# Patient Record
Sex: Female | Born: 1945 | Race: Black or African American | Hispanic: No | Marital: Married | Smoking: Never smoker
Health system: Southern US, Community
[De-identification: ages and names within clinical notes are randomized; demographics above are authoritative.]

## PROBLEM LIST (undated history)

## (undated) DIAGNOSIS — N189 Chronic kidney disease, unspecified: Secondary | ICD-10-CM

## (undated) DIAGNOSIS — L309 Dermatitis, unspecified: Secondary | ICD-10-CM

## (undated) DIAGNOSIS — R011 Cardiac murmur, unspecified: Secondary | ICD-10-CM

## (undated) DIAGNOSIS — R413 Other amnesia: Secondary | ICD-10-CM

## (undated) DIAGNOSIS — F29 Unspecified psychosis not due to a substance or known physiological condition: Secondary | ICD-10-CM

## (undated) DIAGNOSIS — I10 Essential (primary) hypertension: Secondary | ICD-10-CM

## (undated) DIAGNOSIS — G301 Alzheimer's disease with late onset: Principal | ICD-10-CM

## (undated) DIAGNOSIS — G43909 Migraine, unspecified, not intractable, without status migrainosus: Secondary | ICD-10-CM

## (undated) DIAGNOSIS — K219 Gastro-esophageal reflux disease without esophagitis: Secondary | ICD-10-CM

## (undated) DIAGNOSIS — E039 Hypothyroidism, unspecified: Secondary | ICD-10-CM

## (undated) DIAGNOSIS — Q249 Congenital malformation of heart, unspecified: Secondary | ICD-10-CM

## (undated) DIAGNOSIS — M1990 Unspecified osteoarthritis, unspecified site: Secondary | ICD-10-CM

## (undated) DIAGNOSIS — E119 Type 2 diabetes mellitus without complications: Secondary | ICD-10-CM

## (undated) DIAGNOSIS — E785 Hyperlipidemia, unspecified: Secondary | ICD-10-CM

## (undated) DIAGNOSIS — G3183 Dementia with Lewy bodies: Secondary | ICD-10-CM

## (undated) DIAGNOSIS — J310 Chronic rhinitis: Secondary | ICD-10-CM

## (undated) DIAGNOSIS — F329 Major depressive disorder, single episode, unspecified: Secondary | ICD-10-CM

## (undated) DIAGNOSIS — F0150 Vascular dementia without behavioral disturbance: Secondary | ICD-10-CM

## (undated) DIAGNOSIS — F0280 Dementia in other diseases classified elsewhere without behavioral disturbance: Principal | ICD-10-CM

## (undated) HISTORY — DX: Migraine, unspecified, not intractable, without status migrainosus: G43.909

## (undated) HISTORY — DX: Congenital malformation of heart, unspecified: Q24.9

## (undated) HISTORY — DX: Chronic rhinitis: J31.0

## (undated) HISTORY — DX: Vascular dementia without behavioral disturbance: F01.50

## (undated) HISTORY — DX: Unspecified psychosis not due to a substance or known physiological condition: F29

## (undated) HISTORY — PX: ABDOMINAL HYSTERECTOMY: SHX81

## (undated) HISTORY — PX: STOMACH SURGERY: SHX791

## (undated) HISTORY — DX: Hypothyroidism, unspecified: E03.9

## (undated) HISTORY — DX: Gastro-esophageal reflux disease without esophagitis: K21.9

## (undated) HISTORY — DX: Dementia in other diseases classified elsewhere without behavioral disturbance: F02.80

## (undated) HISTORY — DX: Cardiac murmur, unspecified: R01.1

## (undated) HISTORY — DX: Major depressive disorder, single episode, unspecified: F32.9

## (undated) HISTORY — PX: DENTAL SURGERY: SHX609

## (undated) HISTORY — DX: Essential (primary) hypertension: I10

## (undated) HISTORY — DX: Other amnesia: R41.3

## (undated) HISTORY — DX: Type 2 diabetes mellitus without complications: E11.9

## (undated) HISTORY — DX: Alzheimer's disease with late onset: G30.1

## (undated) HISTORY — DX: Hyperlipidemia, unspecified: E78.5

## (undated) HISTORY — DX: Dermatitis, unspecified: L30.9

## (undated) HISTORY — DX: Unspecified osteoarthritis, unspecified site: M19.90

## (undated) HISTORY — DX: Chronic kidney disease, unspecified: N18.9

## (undated) HISTORY — DX: Dementia with Lewy bodies: G31.83

## (undated) HISTORY — PX: THYROID SURGERY: SHX805

---

## 1998-01-26 ENCOUNTER — Ambulatory Visit (HOSPITAL_COMMUNITY): Admission: RE | Admit: 1998-01-26 | Discharge: 1998-01-26 | Payer: Self-pay | Admitting: General Surgery

## 2001-10-19 ENCOUNTER — Encounter: Admission: RE | Admit: 2001-10-19 | Discharge: 2001-10-19 | Payer: Self-pay | Admitting: Occupational Medicine

## 2001-10-19 ENCOUNTER — Encounter: Payer: Self-pay | Admitting: Occupational Medicine

## 2001-11-27 ENCOUNTER — Encounter: Admission: RE | Admit: 2001-11-27 | Discharge: 2002-02-25 | Payer: Self-pay | Admitting: Family Medicine

## 2003-09-04 ENCOUNTER — Encounter: Admission: RE | Admit: 2003-09-04 | Discharge: 2003-12-03 | Payer: Self-pay | Admitting: General Practice

## 2003-09-25 ENCOUNTER — Ambulatory Visit (HOSPITAL_COMMUNITY): Admission: RE | Admit: 2003-09-25 | Discharge: 2003-09-25 | Payer: Self-pay | Admitting: Family Medicine

## 2003-10-03 ENCOUNTER — Other Ambulatory Visit: Admission: RE | Admit: 2003-10-03 | Discharge: 2003-10-03 | Payer: Self-pay | Admitting: Obstetrics and Gynecology

## 2003-10-09 ENCOUNTER — Ambulatory Visit (HOSPITAL_COMMUNITY): Admission: RE | Admit: 2003-10-09 | Discharge: 2003-10-09 | Payer: Self-pay | Admitting: Obstetrics and Gynecology

## 2005-08-10 ENCOUNTER — Encounter (HOSPITAL_BASED_OUTPATIENT_CLINIC_OR_DEPARTMENT_OTHER): Payer: Self-pay | Admitting: General Surgery

## 2006-04-25 ENCOUNTER — Ambulatory Visit (HOSPITAL_COMMUNITY): Admission: RE | Admit: 2006-04-25 | Discharge: 2006-04-25 | Payer: Self-pay | Admitting: Internal Medicine

## 2006-08-16 ENCOUNTER — Inpatient Hospital Stay (HOSPITAL_COMMUNITY): Admission: EM | Admit: 2006-08-16 | Discharge: 2006-08-23 | Payer: Self-pay | Admitting: *Deleted

## 2006-08-19 ENCOUNTER — Encounter (INDEPENDENT_AMBULATORY_CARE_PROVIDER_SITE_OTHER): Payer: Self-pay | Admitting: Specialist

## 2006-09-02 ENCOUNTER — Emergency Department (HOSPITAL_COMMUNITY): Admission: EM | Admit: 2006-09-02 | Discharge: 2006-09-03 | Payer: Self-pay | Admitting: Emergency Medicine

## 2006-09-06 ENCOUNTER — Ambulatory Visit: Payer: Self-pay | Admitting: Gastroenterology

## 2007-04-30 ENCOUNTER — Ambulatory Visit (HOSPITAL_COMMUNITY): Admission: RE | Admit: 2007-04-30 | Discharge: 2007-04-30 | Payer: Self-pay | Admitting: Internal Medicine

## 2007-07-22 ENCOUNTER — Inpatient Hospital Stay (HOSPITAL_COMMUNITY): Admission: EM | Admit: 2007-07-22 | Discharge: 2007-07-30 | Payer: Self-pay | Admitting: Emergency Medicine

## 2007-07-26 ENCOUNTER — Ambulatory Visit: Payer: Self-pay | Admitting: Internal Medicine

## 2008-04-01 ENCOUNTER — Emergency Department (HOSPITAL_COMMUNITY): Admission: EM | Admit: 2008-04-01 | Discharge: 2008-04-01 | Payer: Self-pay | Admitting: Emergency Medicine

## 2008-09-17 ENCOUNTER — Encounter: Admission: RE | Admit: 2008-09-17 | Discharge: 2008-09-17 | Payer: Self-pay | Admitting: Internal Medicine

## 2009-11-04 ENCOUNTER — Inpatient Hospital Stay (HOSPITAL_COMMUNITY): Admission: EM | Admit: 2009-11-04 | Discharge: 2009-11-08 | Payer: Self-pay | Admitting: Emergency Medicine

## 2009-11-04 ENCOUNTER — Ambulatory Visit: Payer: Self-pay | Admitting: Internal Medicine

## 2010-01-08 ENCOUNTER — Encounter: Admission: RE | Admit: 2010-01-08 | Discharge: 2010-01-08 | Payer: Self-pay | Admitting: Internal Medicine

## 2010-01-15 ENCOUNTER — Encounter: Admission: RE | Admit: 2010-01-15 | Discharge: 2010-01-15 | Payer: Self-pay | Admitting: Internal Medicine

## 2010-06-26 LAB — GLUCOSE, CAPILLARY
Glucose-Capillary: 102 mg/dL — ABNORMAL HIGH (ref 70–99)
Glucose-Capillary: 110 mg/dL — ABNORMAL HIGH (ref 70–99)
Glucose-Capillary: 122 mg/dL — ABNORMAL HIGH (ref 70–99)
Glucose-Capillary: 126 mg/dL — ABNORMAL HIGH (ref 70–99)
Glucose-Capillary: 128 mg/dL — ABNORMAL HIGH (ref 70–99)
Glucose-Capillary: 141 mg/dL — ABNORMAL HIGH (ref 70–99)
Glucose-Capillary: 151 mg/dL — ABNORMAL HIGH (ref 70–99)
Glucose-Capillary: 57 mg/dL — ABNORMAL LOW (ref 70–99)
Glucose-Capillary: 67 mg/dL — ABNORMAL LOW (ref 70–99)
Glucose-Capillary: 81 mg/dL (ref 70–99)
Glucose-Capillary: 82 mg/dL (ref 70–99)
Glucose-Capillary: 94 mg/dL (ref 70–99)
Glucose-Capillary: 95 mg/dL (ref 70–99)
Glucose-Capillary: 97 mg/dL (ref 70–99)
Glucose-Capillary: 97 mg/dL (ref 70–99)

## 2010-06-26 LAB — BASIC METABOLIC PANEL
BUN: 16 mg/dL (ref 6–23)
CO2: 27 mEq/L (ref 19–32)
Calcium: 8.5 mg/dL (ref 8.4–10.5)
Calcium: 8.9 mg/dL (ref 8.4–10.5)
Chloride: 105 mEq/L (ref 96–112)
Creatinine, Ser: 1.12 mg/dL (ref 0.4–1.2)
GFR calc Af Amer: 55 mL/min — ABNORMAL LOW (ref >60)
GFR calc Af Amer: 60 mL/min — ABNORMAL LOW (ref >60)
GFR calc non Af Amer: 45 mL/min — ABNORMAL LOW (ref >60)
Glucose, Bld: 111 mg/dL — ABNORMAL HIGH (ref 70–99)
Glucose, Bld: 113 mg/dL — ABNORMAL HIGH (ref 70–99)
Potassium: 4.1 mEq/L (ref 3.5–5.1)
Potassium: 4.5 mEq/L (ref 3.5–5.1)
Sodium: 139 mEq/L (ref 135–145)
Sodium: 141 mEq/L (ref 135–145)

## 2010-06-26 LAB — HEMOGLOBIN AND HEMATOCRIT, BLOOD
HCT: 24 % — ABNORMAL LOW (ref 36.0–46.0)
HCT: 25.3 % — ABNORMAL LOW (ref 36.0–46.0)
HCT: 25.5 % — ABNORMAL LOW (ref 36.0–46.0)
HCT: 25.8 % — ABNORMAL LOW (ref 36.0–46.0)
HCT: 27.6 % — ABNORMAL LOW (ref 36.0–46.0)
HCT: 28.6 % — ABNORMAL LOW (ref 36.0–46.0)
Hemoglobin: 8 g/dL — ABNORMAL LOW (ref 12.0–15.0)
Hemoglobin: 8.6 g/dL — ABNORMAL LOW (ref 12.0–15.0)
Hemoglobin: 8.7 g/dL — ABNORMAL LOW (ref 12.0–15.0)
Hemoglobin: 9.4 g/dL — ABNORMAL LOW (ref 12.0–15.0)
Hemoglobin: 9.5 g/dL — ABNORMAL LOW (ref 12.0–15.0)
Hemoglobin: 9.9 g/dL — ABNORMAL LOW (ref 12.0–15.0)

## 2010-06-26 LAB — URINALYSIS, ROUTINE W REFLEX MICROSCOPIC
Glucose, UA: NEGATIVE mg/dL
Ketones, ur: NEGATIVE mg/dL
Nitrite: NEGATIVE
Protein, ur: NEGATIVE mg/dL
Specific Gravity, Urine: 1.004 — ABNORMAL LOW (ref 1.005–1.030)
Urobilinogen, UA: 0.2 mg/dL (ref 0.0–1.0)

## 2010-06-26 LAB — CBC
HCT: 21.4 % — ABNORMAL LOW (ref 36.0–46.0)
HCT: 26.3 % — ABNORMAL LOW (ref 36.0–46.0)
HCT: 26.7 % — ABNORMAL LOW (ref 36.0–46.0)
Hemoglobin: 7.2 g/dL — ABNORMAL LOW (ref 12.0–15.0)
Hemoglobin: 8.8 g/dL — ABNORMAL LOW (ref 12.0–15.0)
Hemoglobin: 9.5 g/dL — ABNORMAL LOW (ref 12.0–15.0)
MCH: 31.3 pg (ref 26.0–34.0)
MCH: 31.4 pg (ref 26.0–34.0)
MCH: 31.4 pg (ref 26.0–34.0)
MCH: 31.5 pg (ref 26.0–34.0)
MCH: 31.9 pg (ref 26.0–34.0)
MCHC: 33.7 g/dL (ref 30.0–36.0)
MCHC: 33.9 g/dL (ref 30.0–36.0)
MCHC: 34.5 g/dL (ref 30.0–36.0)
MCV: 93 fL (ref 78.0–100.0)
MCV: 93.3 fL (ref 78.0–100.0)
MCV: 93.3 fL (ref 78.0–100.0)
Platelets: 153 10*3/uL (ref 150–400)
Platelets: 167 10*3/uL (ref 150–400)
Platelets: 180 10*3/uL (ref 150–400)
RBC: 2.82 MIL/uL — ABNORMAL LOW (ref 3.87–5.11)
RDW: 13.2 % (ref 11.5–15.5)
RDW: 13.3 % (ref 11.5–15.5)
WBC: 6.2 10*3/uL (ref 4.0–10.5)
WBC: 6.6 10*3/uL (ref 4.0–10.5)
WBC: 8.1 10*3/uL (ref 4.0–10.5)

## 2010-06-26 LAB — COMPREHENSIVE METABOLIC PANEL
Albumin: 3.1 g/dL — ABNORMAL LOW (ref 3.5–5.2)
BUN: 15 mg/dL (ref 6–23)
Creatinine, Ser: 1.23 mg/dL — ABNORMAL HIGH (ref 0.4–1.2)
Glucose, Bld: 134 mg/dL — ABNORMAL HIGH (ref 70–99)
Total Protein: 6.5 g/dL (ref 6.0–8.3)

## 2010-06-26 LAB — TSH: TSH: 2.462 u[IU]/mL (ref 0.350–4.500)

## 2010-06-26 LAB — IRON AND TIBC
Saturation Ratios: 28 % (ref 20–55)
UIBC: 157 ug/dL

## 2010-06-26 LAB — TYPE AND SCREEN: ABO/RH(D): O POS

## 2010-06-26 LAB — URINE MICROSCOPIC-ADD ON

## 2010-06-26 LAB — APTT: aPTT: 30 seconds (ref 24–37)

## 2010-06-26 LAB — POCT I-STAT, CHEM 8
Calcium, Ion: 1.11 mmol/L — ABNORMAL LOW (ref 1.12–1.32)
Chloride: 104 mEq/L (ref 96–112)
HCT: 28 % — ABNORMAL LOW (ref 36.0–46.0)
Hemoglobin: 9.5 g/dL — ABNORMAL LOW (ref 12.0–15.0)
TCO2: 24 mmol/L (ref 0–100)

## 2010-06-26 LAB — LIPID PANEL
Cholesterol: 142 mg/dL (ref 0–200)
HDL: 50 mg/dL (ref >39)
Triglycerides: 91 mg/dL (ref <150)

## 2010-06-26 LAB — HEMOGLOBIN A1C
Hgb A1c MFr Bld: 6.3 % — ABNORMAL HIGH (ref <5.7)
Mean Plasma Glucose: 134 mg/dL — ABNORMAL HIGH (ref <117)

## 2010-06-26 LAB — PROTIME-INR: Prothrombin Time: 13.5 seconds (ref 11.6–15.2)

## 2010-06-26 LAB — LACTIC ACID, PLASMA: Lactic Acid, Venous: 0.9 mmol/L (ref 0.5–2.2)

## 2010-08-24 NOTE — Consult Note (Signed)
NAME:  [name] [name]              ACCOUNT NO.:  [id number]   MEDICAL RECORD NO.:  [id number]          PATIENT TYPE:  INP   LOCATION:  5022                         FACILITY:  [facility]   PHYSICIAN:  Wilmon Arms. Corliss Skains, M.D. DATE OF BIRTH:  November 03, 1945   DATE OF CONSULTATION:  DATE OF DISCHARGE:                                 CONSULTATION   REASON FOR CONSULTATION:  Small bowel obstruction.   The patient is a 65 year old female with a past medical history of  diabetes, hypertension, hyperlipidemia, status post hysterectomy, and  partial thyroidectomy who presents with a 4- to 5-day history of nausea,  vomiting, and abdominal distention.  Her last bowel movement was 4 days  ago.  The patient has also had some chest pain in her left chest.  She  presents to the emergency department and was noted to have some volume  depletion.  X-rays showed a small bowel obstruction with multiple air-  fluid levels and distended loops of small bowel.  She has been admitted  by Dr. Earl Gala, we are asked to consult.   Upon reviewing the patient's chart, she had an admission in 2008 with  similar symptoms of abdominal pain, nausea and vomiting.  She underwent  a GI workup by Dr. Elnoria Howard including a gastric emptying study, which was  normal.  She spontaneously resolved these symptoms and was discharged  home with no further workup.  An EGD showed gastric ulcers and a  colonoscopy showed diverticulosis.   PAST MEDICAL HISTORY:  1. Diabetes.  2. Hypertension.  3. Hyperlipidemia.  4. Allergic rhinitis.  5. Diabetic retinopathy.   PAST SURGICAL HISTORY:  1. Hysterectomy.  2. Partial thyroidectomy.   ALLERGIES:  None.   MEDICATIONS:  Actos, Lipitor, Kapidex, Pepto-Bismol p.r.n., aspirin 81  mg daily, and Benicar HCT.   SOCIAL HISTORY:  Nonsmoker and nondrinker.   FAMILY HISTORY:  Noncontributory.   PHYSICAL EXAMINATION:  VITAL SIGNS:  Temperature 97.8, blood pressure  134/52, pulse 78, respirations 20,  and sats 98%.  GENERAL:  This is a well-developed and well-nourished female in no  apparent distress.  HEENT:  EOMI, sclerae anicteric.  NECK:  No masses.  No thyromegaly.  Healed incisions.  LUNGS:  Clear to auscultation bilaterally.  Normal respiratory effort.  HEART:  Regular rate and rhythm.  No murmur.  ABDOMEN:  Obese, soft, and mildly distended.  No real tenderness.  Positive bowel sounds.  EXTREMITIES:  No edema.  SKIN:  Warm and dry with no sign of jaundice.   The patient has not had any nausea or vomiting since being in the  emergency department.   LABORATORY DATA:  White count 9.9, hemoglobin 12.8, and platelet count  252.  Cardiac enzymes are negative.  Electrolytes; sodium 128, potassium  4.5, chloride 90, bicarb 27, BUN 33, creatinine 2.06, and total  bilirubin 1.4.  Alk phos, AST, and ALT are normal.  Lipase is 50.   IMPRESSION:  1. Probable small bowel obstruction, possibly from adhesions.  2. Volume depletion from persistent nausea and vomiting.   RECOMMENDATIONS:  1. The patient is scheduled for a CT scan  without IV contrast, but      with p.o. contrast.  Agree with this plan.  Would place on NG tube      to intermittent low wall suction.  2. Aggressive rehydration.  3. Bowel rest.  We will continue to follow with you.  If she shows no      progression then she may require exploration.      Wilmon Arms. Tsuei, M.D.  Electronically Signed     MKT/MEDQ  D:  07/22/2007  T:  07/23/2007  Job:  [id number]

## 2010-08-24 NOTE — Op Note (Signed)
NAME:  Divelbiss, [name]              ACCOUNT NO.:  [id number]   MEDICAL RECORD NO.:  [id number]          PATIENT TYPE:  INP   LOCATION:  5022                         FACILITY:  [facility]   PHYSICIAN:  Anselm Pancoast. Weatherly, M.D.DATE OF BIRTH:  09/15/1945   DATE OF PROCEDURE:  07/24/2007  DATE OF DISCHARGE:                               OPERATIVE REPORT   PREOPERATIVE DIAGNOSIS:  Small-bowel obstruction, probably adhesions.   POSTOPERATIVE DIAGNOSIS:  Small-bowel obstruction, secondary to  adhesions.   OPERATION:  Exploratory laparotomy, lysis of adhesion for release of  mechanical small-bowel obstruction.   SURGEON:  Dr. Anselm Pancoast. Weatherly M.D.   ASSISTANT:  Gabrielle Dare. Janee Morn, M.D.   General anesthesia.   HISTORY:  [name] [name] is a 65 year old female who was admitted by Dr.  Gloris Ham on July 22, 2007, with abdominal pain and distention.  She has  had recurrent episodes of mild pain.  She was seen by Dr. Jeani Hawking  and she was admitted and a surgical consultation was requested.  She was  seen by Dr. __________.   PHYSICAL EXAMINATION:  She was not acutely tender.  She was mildly  edematous or mildly distended and had had a couple of small-bowel  movements.  Had an NG-tube placed and over the past 24-hours, there has  really been no improvement.  Repeat x-rays showed more small bowel  distention.  The NG-tube is in good position.  I saw her today for the  first time.  She is afebrile.  Her BUN and creatinine had gone up since  she was not receiving a significant amount of IV fluids and I called for  Dr. Elnoria Howard and gave her fluid bolus and recommended that we proceed with a  laparotomy.  She had had a flat and upright abdominal films on admission  and yesterday and today, and then she had had a CT on the 12th that they  were not adamant that it was a definite surgical problem, but they  thought it was highly possible.  Preop she was given 3 grams of Unasyn.  She has PAS  stockings and after induction of general anesthesia a Foley  catheter was inserted.  The abdomen was prepped with Betadine solution  and draped in sterile manner.  A small incision was made above and below  the umbilicus so I could get my fingers in and carefully open into the  peritoneal cavity and there was a moderate amount of kind of ascites, a  serous type of slightly blood tinged fluid, likely see an obstruction.  We opened the incision a little inferiorly since it feels that the  adhesions were down in the pelvis and then I could see that there was a  loop of small bowel that had been called within a little adhesion that  went from the omentum to probably appendices epiploica.  These were  divided and then there was a second little area and then a very  distended small bowel and the transition was probably about a third  before the ileocecal valve area.  The bowel picked up with release of  the  adhesions and then we manipulated the small bowel proximally.  The  NG tube was repositioned and then sucked out about a liter of very thick  small bowel contents.  The fluid was irrigated, aspirated and then the  midline incision was closed with running #1 Novofil, and then the  skin was closed with staples and a few 3-0 Vicryl sutures in the fatty  tissue.  We will need to continue with the NG suction, watch her fluids  postoperatively as far as getting her re-hydrated and we will keep her  on antibiotics for about 3 doses.           ______________________________  Anselm Pancoast. Zachery Dakins, M.D.     WJW/MEDQ  D:  07/24/2007  T:  07/25/2007  Job:  [id number]   cc:   Jordan Hawks. Elnoria Howard, MD  Dr. August Albino

## 2010-08-27 NOTE — Discharge Summary (Signed)
NAME:  Pietila, [place]              ACCOUNT NO.:  [id number]   MEDICAL RECORD NO.:  [id number]          PATIENT TYPE:  INP   LOCATION:  5022                         FACILITY:  [facility]   PHYSICIAN:  Fleet Contras, M.D.    DATE OF BIRTH:  August 04, 1945   DATE OF ADMISSION:  07/22/2007  DATE OF DISCHARGE:  07/30/2007                               DISCHARGE SUMMARY   ATTENDING PHYSICIAN:  Jarome Matin, MD   HISTORY OF PRESENT ILLNESS:  Ms. Mellor is a 65 year old African  American lady with past medical history significant for type 2 diabetes  mellitus and peptic ulcer disease, who presented to the emergency room  at [facility] with complaints of nausea, vomiting, and  abdominal pain, radiating to her back.  She took some Imodium and Pepto-  Bismol without relief.  She also has a history of systemic hypertension  and dyslipidemia.  While at emergency room, her vital signs were stable.  The patient has moderate-to-severe abdominal pain.  She was tenderness  to palpation in abdomen diffusely.  There was some distention.  Bowel  sounds were present, they are scanty.  She was thought to have possible  small bowel obstruction, most likely due to adhesions from previous  hysterectomy.  A gastroenterology and surgical consults were requested.  CT scan of the abdomen without contrast was obtained.  She had elevated  BUN of 53 and creatinine of 2.0.  KUB x-ray showed the step-ladder  appearance suggestive of small bowel obstruction, but there was no free  air.  She was initially treated conservatively with IV fluids,  nasogastric tube suctioning, and I's and O's monitoring.  The patient  was, therefore, taken to the OR by Dr. Zachery Dakins on July 24, 2007, for  exploratory laparotomy and lysis of adhesions.  The patient tolerated  this well and her symptoms improved significantly and gradually she was  able to tolerate oral intake, and on July 30, 2007, she was feeling  much better,  eating a full diet.  Vital signs were stable.  Abdomen was  full, soft with clean wound.  Laboratory data showed sodium of 139,  potassium 3.6, chloride 105, bicarbonate of 25, BUN of 3, creatinine  1.14, and glucose of 106.  White count was 6.3, hemoglobin 8.7, and  platelet count of 234.  She was therefore considered stable for  discharge home.   DISCHARGE DIAGNOSES:  1. Small bowel obstruction status post laparotomy and adhesion lysis.  2. Anemia.  3. Type 2 diabetes mellitus.  4. Systemic hypertension.   CONDITION ON DISCHARGE:  Stable.   DISPOSITION:  Home.   FOLLOWUP:  Followup was to be with me in 1-2 weeks with Dr. Zachery Dakins on  July 31, 2007, for staple removal.   DISCHARGE MEDICATIONS:  1. Actos 20 mg once daily.  2. Lipitor 20 mg daily.  3. Kapidex 60 mg daily.  4. Aspirin 81 mg daily.  5. Vicodin 5/500 one p.o. q. 6 p.r.n. for pain.   Final discharge was discussed with her and questions were answered.      Fleet Contras, M.D.  Electronically  Signed     EA/MEDQ  D:  09/12/2007  T:  09/13/2007  Job:  [id number]

## 2010-08-27 NOTE — Consult Note (Signed)
NAME:  [name], [name]              ACCOUNT NO.:  [id number]   MEDICAL RECORD NO.:  [id number]          PATIENT TYPE:  INP   LOCATION:  4712                         FACILITY:  [facility]   PHYSICIAN:  Jordan Hawks. Elnoria Howard, MD    DATE OF BIRTH:  08-31-1945   DATE OF CONSULTATION:  08/18/2006  DATE OF DISCHARGE:                                 CONSULTATION   REFERRING PHYSICIAN:  Dr. Fleet Contras.   PRIMARY CARE Edom Schmuhl:  Dr. Fleet Contras.   REASON FOR CONSULTATION:  Abdominal pain, nausea and vomiting and  anemia.   HISTORY OF PRESENT ILLNESS:  This is a 65 year old female with a past  medical history of diabetes, hypertension, hyperlipidemia, status post  hysterectomy and status post partial thyroidectomy, who is admitted to  the hospital with complaints of abdominal pain, nausea and vomiting and  weight loss.  The patient states that her symptoms started in November  when she had the flu.  Since that time, she states that her taste buds  have changed and she has not had any taste for any types of food;  subsequently, over the duration of time, she has lost weight from 240  pounds to her current weight in the 180.  She reports having symptoms of  early satiety with resultant nausea and vomiting if she attempts to eat  too great of a volume.  There is abdominal pain that is associated with  her discomfort and it is in the epigastric and left upper quadrant  region.  The patient does report having mild dysphagia symptoms which  are intermittent and she is uncertain if the use of Protonix has been of  any of benefit for her at this time.  Additionally, the patient does  report having some issues with constipation and she has noted  intermittent hematochezia.  While in the hospital, her hemoglobin has  declined from an admission value of 9.8 down to 7.7.  However, there are  no overt signs of massive GI bleeding.  She complains of having  constipation issues, but no reports of diarrhea.   PAST MEDICAL HISTORY AND PAST SURGICAL HISTORY:  As stated above.   FAMILY HISTORY:  Noncontributory.   SOCIAL HISTORY:  The patient denies any alcohol, tobacco or illicit drug  use.   REVIEW OF SYSTEMS:  As stated above in history of present illness,  otherwise negative.   ALLERGIES:  No known drug allergies.   MEDICATIONS:  Aspirin, ciprofloxacin, Lovenox, Protonix, Zocor,  albuterol and Phenergan.   PHYSICAL EXAMINATION:  VITAL SIGNS:  Blood pressure is 104/52, heart  rate is 64, respirations 18, temperature is 97.6.  GENERAL:  The patient is in no acute distress, alert and oriented.  HEENT:  Normocephalic, atraumatic.  Extraocular muscles intact.  NECK:  Supple.  No lymphadenopathy.  LUNGS:  Clear to auscultation bilaterally.  CARDIOVASCULAR:  Regular rate and rhythm with a 2/6 systolic ejection  murmur.  ABDOMEN:  Obese, soft, mild tenderness in the epigastric and left upper  quadrant region.  No rebound or rigidity.  Positive bowel sounds.  EXTREMITIES:  No clubbing, cyanosis or  edema.  RECTAL:  Examination is heme-positive.   LABORATORY VALUES:  White blood cell count is 5.4, hemoglobin 7.7 and  MCV is 88.3.  Sodium 135, potassium 4.2, chloride 107, CO2 23, glucose  103, BUN 41, creatinine is 2.2.   IMPRESSION:  1. Nausea and vomiting.  2. Abdominal pain.  3. Weight loss.  4. Anemia.  5. Heme-positive stool.   After my initial impression is that the patient has diabetic  gastroparesis in regards to her early satiety symptoms associated nausea  and vomiting and this is concomitant with weight loss.  A further  evaluation with a gastric-emptying scan is required at this time.  However, with the findings of her heme-positive stool and anemia, a  Gastroenterology evaluation is required.  Her iron saturation is at 20  and ferritin is elevated.  I do not suspect that she has iron deficiency  currently, but she will require evaluation.  I am uncertain if she will  be  able to tolerate the prep, but it is certainly worth a try at this  time.   PLAN:  1. Perform an EGD and colonoscopy.  2. Gastric-emptying scan.  3. Further recommendations pending these evaluation.      Jordan Hawks Elnoria Howard, MD  Electronically Signed     PDH/MEDQ  D:  08/18/2006  T:  08/19/2006  Job:  [id number]   cc:   Fleet Contras, M.D.

## 2010-08-27 NOTE — H&P (Signed)
NAME:  Dobias, [name]              ACCOUNT NO.:  [id number]   MEDICAL RECORD NO.:  [id number]          PATIENT TYPE:  INP   LOCATION:  4712                         FACILITY:  [facility]   PHYSICIAN:  Fleet Contras, M.D.    DATE OF BIRTH:  [birth year]   DATE OF ADMISSION:  08/16/2006  DATE OF DISCHARGE:                              HISTORY & PHYSICAL   PRESENTING COMPLAINT:  Nausea, vomiting and weakness.   HISTORY OF PRESENT ILLNESS:  [name] [name] is a 64 year old African  American lady with a past medical history significant for type 2  diabetes mellitus, hypertension, dyslipidemia and allergic rhinitis.  She presented to the office today with 2-week history of progressively  worsening symptoms of nausea, vomiting with oral intake, poor appetite,  anorexia and inability to keep her food down.  She stated her symptoms  started gradually and were associated with some abdominal discomfort  each time she eats and a tendency to throw up whatever she eats.  The  abdominal pain is in the epigastric region, not severe, not radiating  and not associated with hematemesis.  She had been constipated and  denied having any blood in her stools or melena stools.  She had no  fevers or chills but has progressively lost weight because of poor oral  intake and inability to keep her food and drink down.  She is able to  tolerate drinks without any difficulty.  On admission in the office, she  was chronically ill looking.  She was not febrile.  She was not icteric.  She was not cyanosed.  She was tender in the epigastric region with no  guarding or rebound.  There was no palpable masses.   Laboratory data done in the office included a fingerstick glucose that  was 185.  Her urinalysis was positive for large leukocytes.  Specific  gravity of the urine was greater than 1.030.  Point of care CBC shows  white count of 7.8, hemoglobin of 10.1, hematocrit 31.6 and platelet  count of 305.  She was therefore treated  as possible gastroesophageal  reflux disease and started on Prevacid 30 mg daily as well as  ciprofloxacin 250 mg q.12h. for possibly urinary tract infection.  Further laboratory data including thyroid profile, CMET, CT scan of  abdomen and pelvis as well as urine culture were requested.  The patient  was referred to GI for possible endoscopy.  However, she presented to  the emergency room with progressive symptoms later on this afternoon,  and she was noted there to have elevated BUN and creatinine.  She is  therefore being admitted to the hospital for further evaluation and  treatment.   PAST MEDICAL HISTORY:  1. Systemic hypertension.  2. Type 2 diabetes mellitus.  3. Hyperlipidemia.  4. Allergic rhinitis.  5. Diabetic retinopathy.   PAST SURGICAL HISTORY:  1. Hysterectomy.  2. Partial thyroidectomy.   MEDICATION HISTORY:  She is on:  1. Lipitor 120 mg a day.  2. Actos 30 mg daily.  3. Aspirin 81 mg daily.  4. Benicar HCT 40/12.5 one daily.  5. Proventil HFA 2  puffs q.4h. p.r.n.   ALLERGIES:  She has no known drug allergies.   FAMILY HISTORY AND SOCIAL HISTORY:  She lives with her family.  She is  currently unable to work due to her symptoms.  Mother is alive with  diabetes.  She has two brothers, one with diabetes and 6 sisters with  history of diabetes.  She has no children.  She denies any use of  alcohol, tobacco or illicit drugs.   REVIEW OF SYSTEMS:  She has lost approximately about 15 pounds in the  last 3 months.  She have fatigue, weakness.  She has no fevers or  chills.  HEENT:  She has no headaches, blurring of vision, weakness of  extremities or slurring of speech.  RESPIRATORY:  She has no cough,  sputum production, hemoptysis or wheezing.  CARDIAC:  She denies any  chest pain, orthopnea, PND, dyspnea or peripheral edema.  MUSCULOSKELETAL:  She has no joint pains or swelling.  NEUROLOGICALLY:  She has no syncope, seizures or paresthesias.   PHYSICAL  EXAMINATION:  She is chronically ill looking, not in acute  respiratory or painful distress.  Her vital signs show a blood pressure  138/62, heart rate of 80 regular, respiratory rate of 16, temperature is  37.9.  She weighs in as 185 pounds at a height of 5 feet 2 inches.  She  is not pale.  She is not icteric.  She is not cyanosed.  HEENT:  Pupils are equal and reactive to light and accommodation.  Her  oral mucosa is dry.  Her neck is supple with no elevated JVD.  No cervical lymphadenopathy  and no carotid bruit.  CHEST:  Shows good air entry bilaterally with no rales, rhonchi or  wheezes.  HEART:  Sounds 1 and 2 are heard with no murmurs.  No S3 gallops.  No  clicks and no rubs.  ABDOMEN:  Is obese.  There is tenderness in the epigastrium with no  masses.  No hernias and no scars.  There is no guarding or rebound  tenderness.  Bowel sounds are present.  EXTREMITIES:  Shows no edema.  No calf tenderness or swelling.  No  clubbing.  Peripheral pulses are present bilaterally.  CENTRAL NERVOUS SYSTEM:  She is alert, oriented x3 with no focal  neurological deficits.   LABORATORY DATA:  Obtained in the emergency room shows creatinine of  5.1, BUN is 72.  Sodium is 129, potassium 4.7, chloride 106, glucose is  137, bicarbonate of 17 and venous pH is 7.59.  Hemoglobin is 12.2,  hematocrit 36.   ASSESSMENT:  Ms. [name] [name] is a 65 year old African American lady  with past medical history significant for type 2 diabetes mellitus,  hypertension, status post partial thyroidectomy who presented to the  emergency room with progressive nausea, vomiting, inability to keep her  food or drinks down and loss of appetite which started about 2 weeks  ago.  She has associated weight loss and epigastric discomfort.   Her admission diagnoses are:  1. Dehydration.  2. Urinary tract infection.  3. Acute renal failure. 4. Type 2 diabetes mellitus.  5. Systemic hypertension.   PLAN OF CARE:  She  will be admitted to a medical bed.  She will be on  telemetry.  She will be on carbohydrate-modified medium diet.  Vital  signs q.4h.  CBGs will be checked a.c. and h.s. and input and outputs  monitored closely.  Further laboratory data will include CBC with  differential, CMET, amylase, lipase, urinalysis, urine and blood  cultures, anemia profile with phosphorus, serum protein electrophoresis,  urine protein electrophoresis, urine electrolytes and renal ultrasound  scan will be obtained.  She will be started on IV fluids with normal  saline, 1 liter bolus followed by 150 mL an hour.  She will be on IV  Protonix 40 mg daily, IV Phenergan 12.5 mg q.6h. p.r.n. for  nausea and vomiting, and IV Cipro 200 mg q.12h.  Benicar and Actos will  be put on hold.  She received sliding scale NovoLog insulin per  moderately sinsitive protocol without q.h.s. coverage.  This plan of  care has been discussed with her and her questions answered.      Fleet Contras, M.D.  Electronically Signed     EA/MEDQ  D:  08/16/2006  T:  08/17/2006  Job:  [id number]

## 2010-08-27 NOTE — Discharge Summary (Signed)
NAME:  [name], [name]              ACCOUNT NO.:  [id number]   MEDICAL RECORD NO.:  [id number]          PATIENT TYPE:  INP   LOCATION:  5019                         FACILITY:  [facility]   PHYSICIAN:  Fleet Contras, M.D.    DATE OF BIRTH:  1945/08/19   DATE OF ADMISSION:  08/16/2006  DATE OF DISCHARGE:  08/23/2006                               DISCHARGE SUMMARY   HISTORY OF PRESENTING ILLNESS:  [name] [name] is a 60-year African American  lady with past medical history significant for type 2 diabetes mellitus,  hypertension, dyslipidemia and allergic rhinitis.  She was admitted to  the office after a two-week history of progressively worsening symptoms  of nausea and vomiting, poor oral intake, anorexia, inability to keep  her food or drinks down.  She stated that she had a tendency to throw up  whatever she ate.  She has some abdominal pain in the epigastric region,  nonradiating, not severe and not associated with hematemesis.  She had  no hematochezia or melena in her stools.  She had no fevers or chills.  Progressively lost some weight most likely due to inability to keep food  or drinks down.  On admission she was chronically ill-looking, not  febrile, not icteric and not cyanosed.  She was tender in the  epigastrium without any guarding or rebound.  Bowel sounds were present.  In the office a fingerstick glucose was 185.  Urinalysis was positive  for large leukocytes.  White count was 7.8, hemoglobin was 10 and  platelet count was 75.  Other laboratory data was obtained.  On arrival  in the emergency room, her creatinine was 5.1, BUN 72, sodium 129,  potassium 4.7, chloride 109, glucose of 137, bicarbonate 217.  A pH was  7.59.  Hemoglobin 12.2 and hematocrit 36.  She was therefore admitted to  the hospital as a case of dehydration, urinary tract infection, acute  renal failure, systemic hypertension, type 2 diabetes mellitus.   HOSPITAL COURSE:  On admission, she was started on  telemetry bed,  carbohydrate modified medium diet.  Vital signs q.4h.  CBG twice a day  at a.c. and h.s. and sliding scale NovoLog insulin was given as needed.  Urine and blood cultures were obtained.  She was on IV fluids with  normal saline 1 liter bolus followed by 150 mL an hour.  She was on IV  Protonix 40 mg daily, IV Phenergan 12.5 mg q.6h. p.r.n.  She was started  on IV ciprofloxacin 200 mg q.12h.  Blood pressure and diabetic  medications were put on hold.  The day of the admission she complained  of chest pain and acute coronary syndrome was ruled out by negative CKs  and troponins.  Stool hemoccult was performed due to a drop in her  hemoglobin down to 7.7.  Anemia studies showed mild iron deficiency.  She received 2 units of packed red blood cell transfusion.  A  gastroenterology consult was requested and the patient was seen by Dr.  Elnoria Howard, who performed upper GI and lower GI endoscopy on her.  Also a  gastric emptying  scan was performed and this was negative.  Renal  function improved with gentle hydration and also hemoglobin improved  with blood transfusion.  A urine culture was positive for E. coli  resistant to ciprofloxacin and sensitive to nitrofurantoin.  Antibiotics  were therefore changed to Macrodantin.  Colonoscopy showed  diverticulosis.  EGD showed multiple gastric ulcers.  She continued to  improve in her symptomatology.  On Aug 23, 2006, she was feeling much  better without any complaints, tolerating full diet.  Bowel movements  were regular on Colace and Dulcolax.  She was afebrile.  Vital signs  were stable.  She was well-hydrated.  Abdomen was benign.  Hemoglobin  was 9.6, hematocrit 28.5. BUN was 9.  She was therefore considered  stable for discharge home.   DISCHARGE DIAGNOSES:  1. Multiple gastric ulcers.  2. Diverticulosis.  3. Escherichia coli urinary tract infection.  4. Anemia.  5. Iron deficiency.  6. Type 2 diabetes mellitus.  7. Hypertension.   8. Constipation.   CONDITION ON DISCHARGE:  Stable.   DISPOSITION:  Home.   DISCHARGE MEDICATIONS:  1. Protonix 40 mg daily.  2. Macrobid 200 mg b.i.d.  3. Lipitor 20 mg daily.  4. Dulcolax two tablets daily.  5. Albuterol MDI 2 puffs q.6h. p.r.n.  6. Aspirin 81 mg daily.   She was to follow-up with me in one week and with Dr. Elnoria Howard in one week.  She was advised to quit using Actos, Benicar, Advil, Motrin and Aleve.  This plan of care was discussed with her and questions were answered.      Fleet Contras, M.D.  Electronically Signed     EA/MEDQ  D:  09/19/2006  T:  09/20/2006  Job:  [id number]

## 2010-12-14 ENCOUNTER — Other Ambulatory Visit: Payer: Self-pay | Admitting: Internal Medicine

## 2010-12-14 DIAGNOSIS — Z1231 Encounter for screening mammogram for malignant neoplasm of breast: Secondary | ICD-10-CM

## 2010-12-28 ENCOUNTER — Ambulatory Visit (INDEPENDENT_AMBULATORY_CARE_PROVIDER_SITE_OTHER): Payer: BC Managed Care – PPO | Admitting: Gastroenterology

## 2010-12-28 ENCOUNTER — Encounter: Payer: Self-pay | Admitting: Gastroenterology

## 2010-12-28 VITALS — BP 132/60 | HR 78 | Ht 62.0 in | Wt 166.0 lb

## 2010-12-28 DIAGNOSIS — E119 Type 2 diabetes mellitus without complications: Secondary | ICD-10-CM

## 2010-12-28 DIAGNOSIS — K644 Residual hemorrhoidal skin tags: Secondary | ICD-10-CM

## 2010-12-28 DIAGNOSIS — K5900 Constipation, unspecified: Secondary | ICD-10-CM

## 2010-12-28 DIAGNOSIS — K5909 Other constipation: Secondary | ICD-10-CM

## 2010-12-28 DIAGNOSIS — F0390 Unspecified dementia without behavioral disturbance: Secondary | ICD-10-CM

## 2010-12-28 MED ORDER — POLYETHYLENE GLYCOL 3350 17 GM/SCOOP PO POWD: 17.0000 g | Freq: Every day | ORAL | Status: AC

## 2010-12-28 MED ORDER — HYDROCORTISONE ACE-PRAMOXINE 2.5-1 % RE CREA: TOPICAL_CREAM | Freq: Three times a day (TID) | RECTAL | Status: AC

## 2010-12-28 NOTE — Progress Notes (Signed)
History of Present Illness:  This is a demented 65 year old African American female patient of Dr. Melvia Heaps. I am seeing her today in his absence. Her history is very convoluted and almost impossible to discern. She and her husband are both markedly confused sure about her medical problems or medications. I reviewed her hospital records in detail and she has had multiple physicians including Dr. Debroah Loop gastroenterology. He did colonoscopy in June of 2009 which was unremarkable except for diverticulosis. She was admitted to the hospital approximate month ago and was seen in consultation by Dr. Arlyce Dice. At that time it was felt that she had a diverticular hemorrhage. From what I can ascertain, she is not having bleeding at this time except for straining and some fresh blood locally. She has vague abdominal discomfort related to her constipation. She did have surgery by Dr. Zachery Dakins in April of 2000 and cause of intestinal adhesions. Unclear to me why she did not continue her followup with Dr. Debroah Loop. She apparently has chronic acid reflux and is on Prilosec 40 mg a day, also abnormal set diabetes and advancing dementia. Her primary care physician is Dr. Concepcion Elk . The patient has multiple complaints of dysuria, foul-smelling urine, urinary incontinency, pseudoseizures, and general confusion.  I have reviewed this patient's present history, medical and surgical past history, allergies and medications.     ROS: The remainder of the 10 point ROS is negative////unreliable review of system because of dementia. She complains of multiple urinary problems as mentioned above. She apparently has had previous hysterectomy.   Physical Exam: General poorly kept African American female appearing older than her stated age. She is very confused and unable to give an accurate history. She has pseudoseizures whenever approached by physician. Skin no lesions noted Neck supple, no adenopathy, no thyroid enlargement, no  tenderness Chest clear to percussion and auscultation Heart no significant murmurs, gallops or rubs noted Abdomen no hepatosplenomegaly masses or tenderness, BS normal.  Rectal inspection normal no fissures, or fistulae noted.  No masses or tenderness on digital exam. Stool guaiac negative. Large external hemorrhoids noted. There is a large fecal impaction noted. Extremities no acute joint lesions, edema, phlebitis or evidence of cellulitis. Neurologic neurologic exam impossible to complete. As mentioned above, she has frequent pseudoseizures that we'll stop on command.  Assessment and plan: This patient needs primary care coordination of her multiple medical problems. I do not think she has active GI bleeding, but has large hemorrhoids and chronic functional constipation. Whether or not she has renal failure is unclear, but she certainly has multiple genitourinary complaints that need further primary care evaluation and perhaps urology referral. She is not a candidate for any GI procedures in her current state, and needs neurologic referral per primary care ASAP. For her constipation I prescribed MiraLax 30 cc each bedtime and local Analpram cream. She will followup with Dr. Arlyce Dice as needed.  No diagnosis found.   Please copy her primary care physician, referring physician, and pertinent subspecialists.Dr Claudie Revering

## 2010-12-28 NOTE — Patient Instructions (Signed)
Buy Miralax OTC and mix one capful with 8 ounces of water and drink every day.  We have sent you a rx for Analpram you can use it per rectum as needed.  Make an appt with you Primary care MD for urinary complaints.  Please follow up with Dr Arlyce Dice in our office for any futher GI issues.

## 2011-01-04 LAB — CBC
HCT: 31 — ABNORMAL LOW
HCT: 37.4
HCT: 37.6
HCT: 24.6 — ABNORMAL LOW
HCT: 25.4 — ABNORMAL LOW
HCT: 26.6 — ABNORMAL LOW
Hemoglobin: 8.6 — ABNORMAL LOW
Hemoglobin: 8.7 — ABNORMAL LOW
Hemoglobin: 9.8 — ABNORMAL LOW
MCHC: 34
MCHC: 34.1
MCHC: 34.8
MCHC: 34.4
MCV: 90.3
MCV: 90.9
MCV: 91.4
Platelets: 217
Platelets: 252
Platelets: 265
Platelets: 167
Platelets: 204
Platelets: 234
RDW: 13.3
RDW: 13.7
RDW: 13.3
RDW: 13.4
RDW: 13.7
RDW: 13.8
WBC: 5.8
WBC: 6.3
WBC: 7.3

## 2011-01-04 LAB — DIFFERENTIAL
Basophils Absolute: 0
Basophils Relative: 0
Eosinophils Absolute: 0
Eosinophils Relative: 0
Eosinophils Relative: 0
Lymphocytes Relative: 14
Lymphs Abs: 1.3
Monocytes Absolute: 0.7
Monocytes Absolute: 0.7
Neutro Abs: 9.1 — ABNORMAL HIGH

## 2011-01-04 LAB — BASIC METABOLIC PANEL
BUN: 44 — ABNORMAL HIGH
BUN: 3 — ABNORMAL LOW
BUN: 32 — ABNORMAL HIGH
BUN: 6
CO2: 20
CO2: 22
Calcium: 8.3 — ABNORMAL LOW
Calcium: 8 — ABNORMAL LOW
Calcium: 8 — ABNORMAL LOW
Chloride: 98
Chloride: 101
Chloride: 102
Creatinine, Ser: 1.91 — ABNORMAL HIGH
Creatinine, Ser: 1.54 — ABNORMAL HIGH
GFR calc Af Amer: 33 — ABNORMAL LOW
GFR calc Af Amer: 51 — ABNORMAL LOW
GFR calc non Af Amer: 34 — ABNORMAL LOW
GFR calc non Af Amer: 42 — ABNORMAL LOW
GFR calc non Af Amer: 48 — ABNORMAL LOW
GFR calc non Af Amer: 49 — ABNORMAL LOW
Glucose, Bld: 86
Glucose, Bld: 125 — ABNORMAL HIGH
Glucose, Bld: 143 — ABNORMAL HIGH
Glucose, Bld: 150 — ABNORMAL HIGH
Glucose, Bld: 166 — ABNORMAL HIGH
Potassium: 3.6
Potassium: 3.9
Potassium: 4.9
Potassium: 5.3 — ABNORMAL HIGH
Sodium: 133 — ABNORMAL LOW
Sodium: 134 — ABNORMAL LOW

## 2011-01-04 LAB — RENAL FUNCTION PANEL
Calcium: 9.1
Creatinine, Ser: 2.28 — ABNORMAL HIGH
GFR calc Af Amer: 27 — ABNORMAL LOW
Glucose, Bld: 135 — ABNORMAL HIGH
Phosphorus: 5.2 — ABNORMAL HIGH
Sodium: 126 — ABNORMAL LOW

## 2011-01-04 LAB — CROSSMATCH: Antibody Screen: NEGATIVE

## 2011-01-04 LAB — COMPREHENSIVE METABOLIC PANEL
AST: 30
Albumin: 3 — ABNORMAL LOW
Albumin: 3.7
Alkaline Phosphatase: 52
BUN: 33 — ABNORMAL HIGH
BUN: 46 — ABNORMAL HIGH
Calcium: 8.6
Calcium: 9.5
Creatinine, Ser: 2.16 — ABNORMAL HIGH
GFR calc Af Amer: 28 — ABNORMAL LOW
GFR calc non Af Amer: 23 — ABNORMAL LOW
Potassium: 4.5
Sodium: 128 — ABNORMAL LOW
Total Bilirubin: 2.1 — ABNORMAL HIGH
Total Protein: 7.6

## 2011-01-04 LAB — POCT CARDIAC MARKERS
CKMB, poc: 1.3
Myoglobin, poc: 169
Operator id: 294521

## 2011-01-04 LAB — PROTIME-INR
INR: 1.1
Prothrombin Time: 14

## 2011-01-04 LAB — URINALYSIS, ROUTINE W REFLEX MICROSCOPIC
Glucose, UA: NEGATIVE
Hgb urine dipstick: NEGATIVE
Ketones, ur: 15 — AB
Protein, ur: NEGATIVE
Urobilinogen, UA: 0.2

## 2011-01-04 LAB — CA 125: CA 125: 25.4

## 2011-01-04 LAB — APTT: aPTT: 27

## 2011-01-04 LAB — D-DIMER, QUANTITATIVE: D-Dimer, Quant: 3.92 — ABNORMAL HIGH

## 2011-01-10 ENCOUNTER — Ambulatory Visit
Admission: RE | Admit: 2011-01-10 | Discharge: 2011-01-10 | Disposition: A | Discharge status: Home / Self Care | Payer: BC Managed Care – PPO | Source: Ambulatory Visit | Attending: Internal Medicine | Admitting: Internal Medicine

## 2011-01-10 DIAGNOSIS — Z1231 Encounter for screening mammogram for malignant neoplasm of breast: Secondary | ICD-10-CM

## 2012-01-23 ENCOUNTER — Other Ambulatory Visit: Payer: Self-pay | Admitting: Internal Medicine

## 2012-01-23 DIAGNOSIS — Z1231 Encounter for screening mammogram for malignant neoplasm of breast: Secondary | ICD-10-CM

## 2012-02-22 ENCOUNTER — Ambulatory Visit: Payer: BC Managed Care – PPO

## 2012-03-09 ENCOUNTER — Ambulatory Visit
Admission: RE | Admit: 2012-03-09 | Discharge: 2012-03-09 | Disposition: A | Discharge status: Home / Self Care | Payer: BC Managed Care – PPO | Source: Ambulatory Visit | Attending: Internal Medicine | Admitting: Internal Medicine

## 2012-03-09 DIAGNOSIS — Z1231 Encounter for screening mammogram for malignant neoplasm of breast: Secondary | ICD-10-CM

## 2012-03-27 ENCOUNTER — Other Ambulatory Visit (HOSPITAL_COMMUNITY): Payer: Self-pay | Admitting: Cardiovascular Disease

## 2012-03-27 DIAGNOSIS — R011 Cardiac murmur, unspecified: Secondary | ICD-10-CM

## 2012-04-02 ENCOUNTER — Ambulatory Visit (HOSPITAL_COMMUNITY)
Admission: RE | Admit: 2012-04-02 | Discharge: 2012-04-02 | Disposition: A | Discharge status: Home / Self Care | Payer: Medicare Other | Source: Ambulatory Visit | Attending: Cardiovascular Disease | Admitting: Cardiovascular Disease

## 2012-04-02 DIAGNOSIS — R011 Cardiac murmur, unspecified: Secondary | ICD-10-CM

## 2012-04-02 NOTE — Progress Notes (Signed)
[facility]   2D echo completed 04/02/2012.   Cindy Amando Ishikawa, RDCS   

## 2013-04-09 ENCOUNTER — Encounter: Payer: Self-pay | Admitting: Neurology

## 2013-05-01 ENCOUNTER — Ambulatory Visit (INDEPENDENT_AMBULATORY_CARE_PROVIDER_SITE_OTHER): Payer: Medicare PPO | Admitting: Neurology

## 2013-05-01 VITALS — BP 113/56 | HR 60 | Resp 16 | Ht 65.0 in | Wt 184.0 lb

## 2013-05-01 DIAGNOSIS — F0280 Dementia in other diseases classified elsewhere without behavioral disturbance: Secondary | ICD-10-CM

## 2013-05-01 DIAGNOSIS — F0283 Dementia in other diseases classified elsewhere, unspecified severity, with mood disturbance: Secondary | ICD-10-CM

## 2013-05-01 DIAGNOSIS — F3289 Other specified depressive episodes: Secondary | ICD-10-CM

## 2013-05-01 DIAGNOSIS — G301 Alzheimer's disease with late onset: Secondary | ICD-10-CM | POA: Insufficient documentation

## 2013-05-01 DIAGNOSIS — G3183 Dementia with Lewy bodies: Secondary | ICD-10-CM

## 2013-05-01 DIAGNOSIS — G309 Alzheimer's disease, unspecified: Secondary | ICD-10-CM

## 2013-05-01 DIAGNOSIS — F329 Major depressive disorder, single episode, unspecified: Secondary | ICD-10-CM

## 2013-05-01 DIAGNOSIS — F32A Depression, unspecified: Secondary | ICD-10-CM

## 2013-05-01 DIAGNOSIS — G238 Other specified degenerative diseases of basal ganglia: Secondary | ICD-10-CM

## 2013-05-01 DIAGNOSIS — F0281 Dementia in other diseases classified elsewhere with behavioral disturbance: Secondary | ICD-10-CM

## 2013-05-01 DIAGNOSIS — F02818 Dementia in other diseases classified elsewhere, unspecified severity, with other behavioral disturbance: Secondary | ICD-10-CM

## 2013-05-01 DIAGNOSIS — F0150 Vascular dementia without behavioral disturbance: Secondary | ICD-10-CM

## 2013-05-01 DIAGNOSIS — G231 Progressive supranuclear ophthalmoplegia [Steele-Richardson-Olszewski]: Secondary | ICD-10-CM

## 2013-05-01 HISTORY — DX: Dementia in other diseases classified elsewhere, unspecified severity, with mood disturbance: F02.83

## 2013-05-01 HISTORY — DX: Depression, unspecified: F32.A

## 2013-05-01 HISTORY — DX: Vascular dementia, unspecified severity, without behavioral disturbance, psychotic disturbance, mood disturbance, and anxiety: F01.50

## 2013-05-01 HISTORY — DX: Alzheimer's disease with late onset: G30.1

## 2013-05-01 MED ORDER — ALPRAZOLAM 0.5 MG PO TABS: 0.5000 mg | ORAL_TABLET | Freq: Once | ORAL | Status: DC

## 2013-05-01 MED ORDER — MEMANTINE HCL ER 7 & 14 & 21 &28 MG PO CP24: ORAL_CAPSULE | ORAL | Status: DC

## 2013-05-01 MED ORDER — MEMANTINE HCL ER 28 MG PO CP24: 28.0000 mg | ORAL_CAPSULE | ORAL | Status: DC
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
## 2013-05-01 NOTE — Patient Instructions (Addendum)
Dementia With Lewy Bodies Dementia with Lewy bodies is a common type of dementia that gets progressively worse with time. Typical characteristics include progressive worsening of mental function combined with 3 other features: seeing things that are not there (hallucinations), significant fluctuations in alertness and attention, and changes in movement similar to Parkinson's disease (rigid muscles, slowed movement, and tremors). Dementia with Lewy bodies has many similar symptoms that Parkinson's disease and Alzheimer's disease has. CAUSES The symptoms of dementia with Lewy bodies are caused by the buildup of Lewy bodies, which are proteins that build up in brain cells and affect mental function and movement. No one knows exactly what causes the buildup of Lewy bodies, but it may be related to Alzheimer's dementia or Parkinson's disease.  SYMPTOMS   Memory problems.  Confusion.  Reduced attention span.  Hallucinations.  False ideas about another person or situation (delusions).  Trouble moving (rigid muscles, slowed movement, and tremors).  Shuffling movements (gait).  Sleep problems (acting out dreams).  Fluctuating attention (periods of drowsiness or lethargy, long periods of time spent staring into space, or disorganized speech). DIAGNOSIS There is no specific test to diagnose dementia with Lewy bodies, but your caregiver will evaluate you based on your symptoms and physical exam. Your evaluation may also include:  Memory testing.  Lab tests.  Brain imaging (CT scan, MRI).  Electroencephalogram (EEG).  Lumbar puncture. TREATMENT  There is no cure for dementia with Lewy bodies. Medicines may be prescribed to help with mental function and movement.  HOME CARE INSTRUCTIONS The care of individuals with dementia is varied and dependent upon the progression of the dementia. The following suggestions are intended for the person living with, or caring for, the person with  dementia.  Create a safe environment.  Remove the locks on bathroom doors to prevent the person from accidentally locking himself or herself in.  Use childproof latches on kitchen cabinets and any place where cleaning supplies, chemicals, or alcohol are kept.  Use childproof covers in unused electrical outlets.  Install childproof devices to keep doors and windows secured.  Remove stove knobs or install safety knobs and an automatic shut-off on the stove.  Lower the temperature on water heaters.  Label medicines and keep them locked up.  Secure knives, lighters, matches, power tools, and guns, and keep these items out of reach.  Keep the house free from clutter. Remove rugs or anything that might contribute to a fall.  Remove objects that might break and hurt the person.  Make sure lighting is good, both inside and outside.  Install grab rails as needed.  Use a monitoring device to alert you to falls or other needs for help.  Reduce confusion.  Keep familiar objects and people around.  Use night lights or dim lights at night.  Label items or areas.  Use reminders, notes, or directions for daily activities or tasks.  Keep a simple, consistent routine for waking, meals, bathing, dressing, and bedtime.  Create a calm, quiet environment.  Place large clocks and calendars prominently.  Display emergency numbers and the home address near all telephones.  Use cues to establish different times of the day. An example is to open curtains to let the natural light in during the day.  Use effective communication.  Choose simple words and short sentences.  Use a gentle, calm tone of voice.  Be careful not to interrupt.  If the person is struggling to find a word or communicate a thought, try to provide the   word or thought.  Ask one question at a time. Allow the person ample time to answer questions. Repeat the question again if the person does not respond.  Reduce  nighttime restlessness.  Provide a comfortable bed.  Have a consistent nighttime routine.  Ensure a regular walking or physical activity schedule. Involve the person in daily activities as much as possible.  Limit napping during the day.  Limit caffeine.  Attend social events that stimulate rather than overwhelm the senses.  Encourage good nutrition and hydration.  Reduce distractions during meal times and snacks.  Avoid foods that are too hot or too cold.  Monitor chewing and swallowing ability.  Continue with routine vision, hearing, dental, and medical screenings.  Only give over-the-counter or prescription medicines as directed by the caregiver.  Monitor driving abilities. Do not allow the person to drive when safe driving is no longer possible.  Register with an identification program which could provide location assistance in the event of a missing person situation. SEEK MEDICAL CARE IF:  New behavioral problems develop, such as moodiness or aggressiveness.  Any new problem with brain function develop, such as balance, speech, or falling a lot.  Problems with swallowing develop. Small changes or worsening in any aspect of brain function can be a sign that the illness is getting worse. It can also be a sign of another medical illness such as infection. Seeing a caregiver right away is important.  SEEK IMMEDIATE MEDICAL CARE IF:  A fever develops.  Confusion develops or worsens.  New or worsened sleepiness develops or staying awake becomes difficult. Document Released: 12/18/2001 Document Revised: 06/20/2011 Document Reviewed: 11/22/2010 [facility]ExitCare Patient Information 2014 [place], [place].  [name] [name] explained to me that his wife just 2 months ago saw an ophthalmologist I would like to get the records. I understand that that was a limited ability to examine the patient. I have ordered an MRI of the brain with and without contrast she will have Xanax to calm her prior to  the test, and I have started her a new on Namenda we will see how she is doing in 3-4 months.

## 2013-05-02 LAB — BASIC METABOLIC PANEL
BUN / CREAT RATIO: 22 (ref 11–26)
BUN: 52 mg/dL — AB (ref 8–27)
CHLORIDE: 104 mmol/L (ref 96–108)
CO2: 27 mmol/L (ref 18–29)
Calcium: 9.4 mg/dL (ref 8.7–10.3)
Creatinine, Ser: 2.37 mg/dL — ABNORMAL HIGH (ref 0.57–1.00)
GFR calc non Af Amer: 21 mL/min/{1.73_m2} — ABNORMAL LOW (ref >59)
GFR, EST AFRICAN AMERICAN: 24 mL/min/{1.73_m2} — AB (ref >59)
Glucose: 86 mg/dL (ref 65–99)
POTASSIUM: 6 mmol/L — AB (ref 3.5–5.2)
Sodium: 142 mmol/L (ref 134–144)

## 2013-05-10 ENCOUNTER — Other Ambulatory Visit: Payer: Self-pay | Admitting: Neurology

## 2013-05-10 NOTE — Telephone Encounter (Signed)
This Rx was prescribed for use prior to MRI only.  I called the patient.  Spouse said they never got this Rx form the pharmacy.  I called the Pharmacy.  Spoke with Marcelino DusterMichelle, she said they did pick up the Rx on 01/21.  I called the patient back.  They found the Rx.  I explained this was only for use prior to MRI.  He verbalized understanding.  Still has meds, no refill is needed.

## 2013-05-16 ENCOUNTER — Ambulatory Visit
Admission: RE | Admit: 2013-05-16 | Discharge: 2013-05-16 | Disposition: A | Discharge status: Home / Self Care | Payer: Medicare PPO | Source: Ambulatory Visit | Attending: Neurology | Admitting: Neurology

## 2013-05-16 DIAGNOSIS — F329 Major depressive disorder, single episode, unspecified: Principal | ICD-10-CM

## 2013-05-16 DIAGNOSIS — F0280 Dementia in other diseases classified elsewhere without behavioral disturbance: Secondary | ICD-10-CM

## 2013-05-16 DIAGNOSIS — R413 Other amnesia: Secondary | ICD-10-CM

## 2013-05-16 DIAGNOSIS — G301 Alzheimer's disease with late onset: Principal | ICD-10-CM

## 2013-05-16 DIAGNOSIS — G3183 Dementia with Lewy bodies: Secondary | ICD-10-CM

## 2013-05-16 DIAGNOSIS — F0283 Dementia in other diseases classified elsewhere, unspecified severity, with mood disturbance: Secondary | ICD-10-CM

## 2013-05-16 DIAGNOSIS — F0281 Dementia in other diseases classified elsewhere with behavioral disturbance: Secondary | ICD-10-CM

## 2013-05-16 DIAGNOSIS — G231 Progressive supranuclear ophthalmoplegia [Steele-Richardson-Olszewski]: Secondary | ICD-10-CM

## 2013-05-30 NOTE — Progress Notes (Signed)
Quick Note:  Spoke to patient's husband and relayed MRI results, per Dr. Vickey Hugerohmeier. He did not see a need to come in discuss results. ______

## 2013-06-28 ENCOUNTER — Telehealth: Payer: Self-pay | Admitting: Neurology

## 2013-06-28 NOTE — Telephone Encounter (Signed)
Patient's husband would like a call back due to the fact that the medication prescribed to his wife does not seem to be working or they are not  giving it enough time. Please call to advise.

## 2013-06-28 NOTE — Telephone Encounter (Signed)
Called pt and spoke with pt's husband Fayrene FearingJames concerning pt's medication that was prescribed to the pt on last OV on1/21/15. Fayrene FearingJames stated that the pt was prescribed Actos at the last OV. I explained to the pt's husband that Dr. Vickey Hugerohmeier did not prescribe that medication and that Dr. Vickey Hugerohmeier prescribe Memantine 28 mg and pt's husband stated that the pt has not been taking that medication. I told Fayrene FearingJames [facility] and that he should inquire about the medication because the pt has 5 refills. I also inform Fayrene FearingJames that the pt has an f/u appt with Larita FifeLynn, NP on 08/30/13. Fayrene FearingJames stated that he would look into the medication matter and see what's going on. I advised the husband that if the pt has any other problems, questions or concerns to call the office. Fayrene FearingJames verbalized understanding.

## 2013-07-03 NOTE — Telephone Encounter (Signed)
Pt husband called and stated that due to the problems with getting the Memantine they got started on them late.  He said that he picked up the prescription on Thursday, but he waited to start them on Monday. He says that she is on the 3rd pill and he has seen some difference in the early evening.  He wants to keep Dr. Vickey Hugerohmeier in the loop as to how it is going.  Please call Fayrene FearingJames at [phone].  Thank you

## 2013-07-04 NOTE — Telephone Encounter (Signed)
Pt's husband Fayrene FearingJames calling to inform Dr. Vickey Hugerohmeier on how the pt is adjusting with themedication Memantine that pt started Monday. Pt's husband stated that the pt is on her 3 rd pill and he has seen some difference in the early evening. Pt's husband stated that he just wanted to keep Dr. Vickey Hugerohmeier in the loop as to how thing are going. FYI

## 2013-08-16 ENCOUNTER — Telehealth: Payer: Self-pay | Admitting: Neurology

## 2013-08-16 ENCOUNTER — Emergency Department (INDEPENDENT_AMBULATORY_CARE_PROVIDER_SITE_OTHER)
Admission: EM | Admit: 2013-08-16 | Discharge: 2013-08-16 | Disposition: A | Discharge status: Home / Self Care | Payer: Medicare PPO | Source: Home / Self Care

## 2013-08-16 ENCOUNTER — Encounter (HOSPITAL_COMMUNITY): Payer: Self-pay | Admitting: Emergency Medicine

## 2013-08-16 DIAGNOSIS — G4710 Hypersomnia, unspecified: Secondary | ICD-10-CM

## 2013-08-16 LAB — POCT I-STAT, CHEM 8
BUN: 45 mg/dL — ABNORMAL HIGH (ref 6–23)
CALCIUM ION: 1.17 mmol/L (ref 1.13–1.30)
CREATININE: 2.1 mg/dL — AB (ref 0.50–1.10)
Chloride: 105 mEq/L (ref 96–112)
GLUCOSE: 89 mg/dL (ref 70–99)
HEMATOCRIT: 31 % — AB (ref 36.0–46.0)
HEMOGLOBIN: 10.5 g/dL — AB (ref 12.0–15.0)
Potassium: 6.2 mEq/L — ABNORMAL HIGH (ref 3.7–5.3)
Sodium: 140 mEq/L (ref 137–147)
TCO2: 25 mmol/L (ref 0–100)

## 2013-08-16 LAB — POCT URINALYSIS DIP (DEVICE)
Bilirubin Urine: NEGATIVE
Glucose, UA: NEGATIVE mg/dL
Hgb urine dipstick: NEGATIVE
Ketones, ur: NEGATIVE mg/dL
LEUKOCYTES UA: NEGATIVE
NITRITE: NEGATIVE
PH: 7 (ref 5.0–8.0)
PROTEIN: NEGATIVE mg/dL
Specific Gravity, Urine: 1.015 (ref 1.005–1.030)
UROBILINOGEN UA: 0.2 mg/dL (ref 0.0–1.0)

## 2013-08-16 NOTE — ED Notes (Signed)
Adult day care called caregiver and told him patient is sleepier than usual.  Caregiver has noticed increase in sleepiness for a few weeks. Unknown reason. Patient had a medication (namenda) added to medications approx 3-4 weeks ago.  Caregiver does not reports any other changes.

## 2013-08-16 NOTE — Discharge Instructions (Signed)
Hypersomnia Stop the Namenda for now.  Call your doctor for advice about medicine for her Hypersomnia usually brings recurrent episodes of excessive daytime sleepiness or prolonged nighttime sleep. It is different than feeling tired due to lack of or interrupted sleep at night. People with hypersomnia are compelled to nap repeatedly during the day. This is often at inappropriate times such as:  At work.  During a meal.  In conversation. These daytime naps usually provide no relief. This disorder typically affects adolescents and young adults. CAUSES  This condition may be caused by:  Another sleep disorder (such as narcolepsy or sleep apnea).  Dysfunction of the autonomic nervous system.  Drug or alcohol abuse.  A physical problem, such as:  A tumor.  Head trauma. This is damage caused by an accident.  Injury to the central nervous system.  Certain medications, or medicine withdrawal.  Medical conditions may contribute to the disorder, including:  Multiple sclerosis.  Depression.  Encephalitis.  Epilepsy.  Obesity.  Some people appear to have a genetic predisposition to this disorder. In others, there is no known cause. SYMPTOMS   Patients often have difficulty waking from a long sleep. They may feel dazed or confused.  Other symptoms may include:  Anxiety.  Increased irritation (inflammation).  Decreased energy.  Restlessness.  Slow thinking.  Slow speech.  Loss of appetite.  Hallucinations.  Memory difficulty.  Tremors, Tics.  Some patients lose the ability to function in family, social, occupational, or other settings. TREATMENT  Treatment is symptomatic in nature. Stimulants and other drugs may be used to treat this disorder. Changes in behavior may help. For example, avoid night work and social activities that delay bed time. Changes in diet may offer some relief. Patients should avoid alcohol and caffeine. PROGNOSIS  The likely outcome  (prognosis) for persons with hypersomnia depends on the cause of the disorder. The disorder itself is not life threatening. But it can have serious consequences. For example, automobile accidents can be caused by falling asleep while driving. The attacks usually continue indefinitely. Document Released: 03/18/2002 Document Revised: 06/20/2011 Document Reviewed: 02/20/2008 [facility] Patient Information 2014 [employer] [place]

## 2013-08-16 NOTE — ED Provider Notes (Signed)
CSN: [id number]     Arrival date & time 08/16/13  1350 History   First MD Initiated Contact with Patient 08/16/13 1422     Chief Complaint  Patient presents with  . Fatigue   (Consider location/radiation/quality/duration/timing/severity/associated sxs/prior Treatment) HPI Comments: 68 year old female is brought in by a significant other who was called by the adult daycare center stating that she has been having episodes of sleepiness during the day. She tends to fall asleep and then wake up suddenly. The other observation is that of decreased appetite. This however is also a chronic complaint and that she is being treated with Megace. The patient was started on Namenda at the same time that she started becoming  usually sleepy. The doses were increased every week for the past 2-3 weeks before she settled on a long acting medication once a day. There is no known signs of infection, fever, unilateral or focal weakness, vomiting, diarrhea or other associated illnesses. The significant other states that he has been limiting her fluid intake because the Lasix causes her to urinate more. Sig other st she does not appear ill to him nor appear different from her baseline.    Past Medical History  Diagnosis Date  . 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 Chronic kidney disease   . Alzheimer disease   . DM (diabetes mellitus)   . HLD (hyperlipidemia)   . Memory deficits   . Psychosis   . Major depression   . Hypertension   . Migraine   . GERD (gastroesophageal reflux disease)   . Eczema   . Hypothyroidism   . Rhinitis   . Arthritis   . Dementia of the Alzheimer's type, with late onset, with depressive mood 05/01/2013  . 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 Abdominal hysterectomy    . Stomach surgery      ?  Marland Kitchen. Thyroid surgery    . Dental surgery     Family History  Problem Relation Age of Onset  .  Diabetes Mother   . Diabetes Brother   . Diabetes Brother   . Diabetes Sister   . Diabetes Sister   . Diabetes Sister   . Diabetes Sister   . Diabetes Sister   . Diabetes Sister    History  Substance Use Topics  . Smoking status: Never Smoker   . Smokeless tobacco: Never Used  . Alcohol Use: No   OB History   Grav Para Term Preterm Abortions TAB SAB Ect Mult Living                 Review of Systems  Constitutional: Positive for activity change and appetite change. Negative for fever and chills.  HENT: Negative.   Respiratory: Negative for cough, shortness of breath and wheezing.   Cardiovascular: Negative for chest pain.  Gastrointestinal: Negative.   Genitourinary: Negative.   Skin: Negative.     Allergies  Benazepril-hydrochlorothiazide  Home Medications   Prior to Admission medications   Medication Sig Start Date End Date Taking? Authorizing Provider  Memantine HCl (NAMENDA PO) Take by mouth.   Yes Historical Provider, MD  ALPRAZolam Prudy Feeler(XANAX) 0.5 MG tablet Take 1 tablet (0.5 mg total) by mouth once. To be used for anxiety prior to MRI, take one pill 30 minutes before MRI. 05/01/13   Melvyn Novasarmen Dohmeier, MD  aspirin 81 MG tablet Take 81 mg by mouth daily.  Historical Provider, MD  atorvastatin (LIPITOR) 20 MG tablet Take 20 mg by mouth daily.      Historical Provider, MD  donepezil (ARICEPT) 10 MG tablet Take 10 mg by mouth daily.      Historical Provider, MD  ferrous sulfate 325 (65 FE) MG tablet Take 325 mg by mouth daily with breakfast.      Historical Provider, MD  furosemide (LASIX) 20 MG tablet 1 tablet as needed. For swelling 02/27/13   Historical Provider, MD  levocetirizine (XYZAL) 5 MG tablet 1 tablet daily. 04/18/13   Historical Provider, MD  lisinopril (PRINIVIL,ZESTRIL) 10 MG tablet 1 tablet daily. 04/05/13   Historical Provider, MD  megestrol (MEGACE ES) 625 MG/5ML suspension Take 625 mg by mouth daily.      Historical Provider, MD  Memantine HCl ER 28 MG  CP24 Take 28 mg by mouth 1 day or 1 dose. 05/01/13   Melvyn Novasarmen Dohmeier, MD  Memantine HCl ER 7 & 14 & 21 &28 MG CP24 This is a starter pack, follow the intructions week by week and refill at the final dose of 28 mg daily. 05/01/13   Porfirio Mylararmen Dohmeier, MD  omeprazole (PRILOSEC) 40 MG capsule Take 40 mg by mouth daily.      Historical Provider, MD  pioglitazone (ACTOS) 30 MG tablet Take 30 mg by mouth daily.      Historical Provider, MD  venlafaxine (EFFEXOR) 75 MG tablet Take 75 mg by mouth daily.      Historical Provider, MD  Vitamin D, Ergocalciferol, (DRISDOL) 50000 UNITS CAPS Take 50,000 Units by mouth every 7 (seven) days.      Historical Provider, MD   BP 115/60  Pulse 57  Temp(Src) 99.9 F (37.7 C) (Oral)  Resp 18  SpO2 100% Physical Exam  Constitutional: She appears well-developed and well-nourished. No distress.  Patient is observed sitting upright in a wheelchair and she is able to stand with assistance. During the exam she remains awake and alert and responds to questions. She is able to offer responses to most questions in the review of systems. She does not appear to be acutely ill.  HENT:  Mouth/Throat: Oropharynx is clear and moist. No oropharyngeal exudate.  Eyes: Conjunctivae and EOM are normal.  Neck: Normal range of motion. Neck supple.  Cardiovascular: Normal rate, regular rhythm and normal heart sounds.   Pulmonary/Chest: Effort normal and breath sounds normal. No respiratory distress. She has no wheezes. She has no rales.  Abdominal: Soft. There is no tenderness.  Musculoskeletal: She exhibits no edema and no tenderness.  Generalized muscle weakness reported as chronic.  Lymphadenopathy:    She has no cervical adenopathy.  Neurological: She is alert.  No facial asymmetry , speech clear.  Skin: Skin is warm and dry.  Psychiatric: She has a normal mood and affect.    ED Course  Procedures (including critical care time) Labs Review Labs Reviewed  POCT I-STAT, CHEM 8 -  Abnormal; Notable for the following:    Potassium 6.2 (*)    BUN 45 (*)    Creatinine, Ser 2.10 (*)    Hemoglobin 10.5 (*)    HCT 31.0 (*)    All other components within normal limits  POCT URINALYSIS DIP (DEVICE)   Results for orders placed during the hospital encounter of 08/16/13  POCT I-STAT, CHEM 8      Result Value Ref Range   Sodium 140  137 - 147 mEq/L   Potassium 6.2 (*) 3.7 - 5.3 mEq/L  Chloride 105  96 - 112 mEq/L   BUN 45 (*) 6 - 23 mg/dL   Creatinine, Ser 1.61 (*) 0.50 - 1.10 mg/dL   Glucose, Bld 89  70 - 99 mg/dL   Calcium, Ion 0.96  0.45 - 1.30 mmol/L   TCO2 25  0 - 100 mmol/L   Hemoglobin 10.5 (*) 12.0 - 15.0 g/dL   HCT 40.9 (*) 81.1 - 91.4 %  POCT URINALYSIS DIP (DEVICE)      Result Value Ref Range   Glucose, UA NEGATIVE  NEGATIVE mg/dL   Bilirubin Urine NEGATIVE  NEGATIVE   Ketones, ur NEGATIVE  NEGATIVE mg/dL   Specific Gravity, Urine 1.015  1.005 - 1.030   Hgb urine dipstick NEGATIVE  NEGATIVE   pH 7.0  5.0 - 8.0   Protein, ur NEGATIVE  NEGATIVE mg/dL   Urobilinogen, UA 0.2  0.0 - 1.0 mg/dL   Nitrite NEGATIVE  NEGATIVE   Leukocytes, UA NEGATIVE  NEGATIVE    Imaging Review No results found.   MDM   1. Excessive sleepiness      Stop Namenda for now. I believe this is the cause for her dozing off, esp in light of concomitant xyzal, effexor and aricept.  Pt is stable , alert and talkative, she does have Alzheimers but responds appropriately. I can find no physical abnormalities that need immediate attention during this visit.  Labs reviewed, Urine clear, Glucose nl. K a little high but possibly due to caregiver withholding fluids. No fevers or signs of infection. I dont see a benefit in transferring her to ED today. Caregiver will call her MD today and try to schedule an appt sooner .  For any worsening told to go to the ED or see PCP .  Hayden Rasmussen, NP 08/16/13 1537

## 2013-08-16 NOTE — Telephone Encounter (Signed)
Husband brought patient into office today because the adult daycare asked husband to pick up patient due to her sleepiness.  He brought her to our office to be seen but had no appointment.  I explained that we cannot see patient, but I can get a message to the doctor.  He was concerned that the Namenda may be causing this reaction.  I relayed that if he was concerned and sees a change in her behavior it may be best to go to Urgent care, since her doctors office was closed.  He agreed.  The patient has an appointment on 08-30-13 with NP.  If something comes available sooner, I will call.

## 2013-08-17 NOTE — ED Provider Notes (Signed)
Medical screening examination/treatment/procedure(s) were performed by non-physician practitioner and as supervising physician I was immediately available for consultation/collaboration.  Oden Lindaman, M.D.  Lucia Harm C Dilraj Killgore, MD 08/17/13 1820 

## 2013-08-19 ENCOUNTER — Telehealth: Payer: Self-pay | Admitting: *Deleted

## 2013-08-19 ENCOUNTER — Telehealth: Payer: Self-pay | Admitting: Neurology

## 2013-08-19 NOTE — Telephone Encounter (Signed)
We can address the sleepiness after Np visit with the patient later this month. Consider ONO to see if sleep apnea or low oxygen are present.

## 2013-08-19 NOTE — Telephone Encounter (Signed)
Left message that NP doesn't have an earlier appointment and to keep scheduled appointment for 08-30-13 at 0930.

## 2013-08-30 ENCOUNTER — Ambulatory Visit (INDEPENDENT_AMBULATORY_CARE_PROVIDER_SITE_OTHER): Payer: Medicare PPO | Admitting: Nurse Practitioner

## 2013-08-30 ENCOUNTER — Encounter: Payer: Self-pay | Admitting: Nurse Practitioner

## 2013-08-30 DIAGNOSIS — F0280 Dementia in other diseases classified elsewhere without behavioral disturbance: Secondary | ICD-10-CM

## 2013-08-30 DIAGNOSIS — G238 Other specified degenerative diseases of basal ganglia: Secondary | ICD-10-CM

## 2013-08-30 DIAGNOSIS — F02818 Dementia in other diseases classified elsewhere, unspecified severity, with other behavioral disturbance: Secondary | ICD-10-CM

## 2013-08-30 DIAGNOSIS — F0150 Vascular dementia without behavioral disturbance: Secondary | ICD-10-CM

## 2013-08-30 DIAGNOSIS — G3183 Dementia with Lewy bodies: Principal | ICD-10-CM

## 2013-08-30 DIAGNOSIS — F0281 Dementia in other diseases classified elsewhere with behavioral disturbance: Secondary | ICD-10-CM

## 2013-08-30 DIAGNOSIS — G231 Progressive supranuclear ophthalmoplegia [Steele-Richardson-Olszewski]: Secondary | ICD-10-CM

## 2013-08-30 NOTE — Patient Instructions (Signed)
Recommend changing Donepizil to take at Night.  All other medications take during the day.  Do not restart Namenda.  Follow up with Dr. Vickey Huger in 6 months, sooner as needed.
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
## 2013-09-03 NOTE — Progress Notes (Signed)
I agree with the assessment and plan as directed by NP .The patient is known to me .   Timo Hartwig, MD  

## 2013-10-10 ENCOUNTER — Encounter (HOSPITAL_COMMUNITY): Payer: Self-pay | Admitting: Emergency Medicine

## 2013-10-10 ENCOUNTER — Emergency Department (HOSPITAL_COMMUNITY): Payer: Medicare PPO

## 2013-10-10 ENCOUNTER — Emergency Department (HOSPITAL_COMMUNITY)
Admission: EM | Admit: 2013-10-10 | Discharge: 2013-10-10 | Disposition: A | Discharge status: Home / Self Care | Payer: Medicare PPO | Attending: Emergency Medicine | Admitting: Emergency Medicine

## 2013-10-10 DIAGNOSIS — Z792 Long term (current) use of antibiotics: Secondary | ICD-10-CM | POA: Insufficient documentation

## 2013-10-10 DIAGNOSIS — F02818 Dementia in other diseases classified elsewhere, unspecified severity, with other behavioral disturbance: Secondary | ICD-10-CM | POA: Insufficient documentation

## 2013-10-10 DIAGNOSIS — F0280 Dementia in other diseases classified elsewhere without behavioral disturbance: Secondary | ICD-10-CM | POA: Insufficient documentation

## 2013-10-10 DIAGNOSIS — E785 Hyperlipidemia, unspecified: Secondary | ICD-10-CM | POA: Insufficient documentation

## 2013-10-10 DIAGNOSIS — Z79899 Other long term (current) drug therapy: Secondary | ICD-10-CM | POA: Insufficient documentation

## 2013-10-10 DIAGNOSIS — N189 Chronic kidney disease, unspecified: Secondary | ICD-10-CM | POA: Insufficient documentation

## 2013-10-10 DIAGNOSIS — Z8709 Personal history of other diseases of the respiratory system: Secondary | ICD-10-CM | POA: Insufficient documentation

## 2013-10-10 DIAGNOSIS — K219 Gastro-esophageal reflux disease without esophagitis: Secondary | ICD-10-CM | POA: Insufficient documentation

## 2013-10-10 DIAGNOSIS — F0391 Unspecified dementia with behavioral disturbance: Secondary | ICD-10-CM

## 2013-10-10 DIAGNOSIS — E039 Hypothyroidism, unspecified: Secondary | ICD-10-CM | POA: Insufficient documentation

## 2013-10-10 DIAGNOSIS — Z7982 Long term (current) use of aspirin: Secondary | ICD-10-CM | POA: Insufficient documentation

## 2013-10-10 DIAGNOSIS — G309 Alzheimer's disease, unspecified: Secondary | ICD-10-CM | POA: Insufficient documentation

## 2013-10-10 DIAGNOSIS — M129 Arthropathy, unspecified: Secondary | ICD-10-CM | POA: Insufficient documentation

## 2013-10-10 DIAGNOSIS — E119 Type 2 diabetes mellitus without complications: Secondary | ICD-10-CM | POA: Insufficient documentation

## 2013-10-10 DIAGNOSIS — I129 Hypertensive chronic kidney disease with stage 1 through stage 4 chronic kidney disease, or unspecified chronic kidney disease: Secondary | ICD-10-CM | POA: Insufficient documentation

## 2013-10-10 DIAGNOSIS — Z872 Personal history of diseases of the skin and subcutaneous tissue: Secondary | ICD-10-CM | POA: Insufficient documentation

## 2013-10-10 DIAGNOSIS — F0281 Dementia in other diseases classified elsewhere with behavioral disturbance: Secondary | ICD-10-CM | POA: Insufficient documentation

## 2013-10-10 DIAGNOSIS — N289 Disorder of kidney and ureter, unspecified: Secondary | ICD-10-CM

## 2013-10-10 LAB — CBC WITH DIFFERENTIAL/PLATELET
Basophils Absolute: 0 10*3/uL (ref 0.0–0.1)
Basophils Relative: 0 % (ref 0–1)
Eosinophils Absolute: 0.1 10*3/uL (ref 0.0–0.7)
Eosinophils Relative: 2 % (ref 0–5)
HCT: 27.3 % — ABNORMAL LOW (ref 36.0–46.0)
Hemoglobin: 8.8 g/dL — ABNORMAL LOW (ref 12.0–15.0)
Lymphocytes Relative: 34 % (ref 12–46)
Lymphs Abs: 1.9 10*3/uL (ref 0.7–4.0)
MCH: 29.9 pg (ref 26.0–34.0)
MCHC: 32.2 g/dL (ref 30.0–36.0)
MCV: 92.9 fL (ref 78.0–100.0)
Monocytes Absolute: 0.4 10*3/uL (ref 0.1–1.0)
Monocytes Relative: 6 % (ref 3–12)
Neutro Abs: 3.3 10*3/uL (ref 1.7–7.7)
Neutrophils Relative %: 58 % (ref 43–77)
Platelets: 184 10*3/uL (ref 150–400)
RBC: 2.94 MIL/uL — ABNORMAL LOW (ref 3.87–5.11)
RDW: 14.9 % (ref 11.5–15.5)
WBC: 5.7 10*3/uL (ref 4.0–10.5)

## 2013-10-10 LAB — HEPATIC FUNCTION PANEL
ALT: 10 U/L (ref 0–35)
AST: 20 U/L (ref 0–37)
Albumin: 3.7 g/dL (ref 3.5–5.2)
Alkaline Phosphatase: 70 U/L (ref 39–117)
Total Bilirubin: 0.4 mg/dL (ref 0.3–1.2)
Total Protein: 7.5 g/dL (ref 6.0–8.3)

## 2013-10-10 LAB — BASIC METABOLIC PANEL
Anion gap: 17 — ABNORMAL HIGH (ref 5–15)
BUN: 58 mg/dL — ABNORMAL HIGH (ref 6–23)
CO2: 23 mEq/L (ref 19–32)
Calcium: 9.2 mg/dL (ref 8.4–10.5)
Chloride: 100 mEq/L (ref 96–112)
Creatinine, Ser: 2.01 mg/dL — ABNORMAL HIGH (ref 0.50–1.10)
GFR calc Af Amer: 29 mL/min — ABNORMAL LOW (ref >90)
GFR calc non Af Amer: 25 mL/min — ABNORMAL LOW (ref >90)
Glucose, Bld: 114 mg/dL — ABNORMAL HIGH (ref 70–99)
Potassium: 4 mEq/L (ref 3.7–5.3)
Sodium: 140 mEq/L (ref 137–147)

## 2013-10-10 LAB — URINALYSIS, ROUTINE W REFLEX MICROSCOPIC
Bilirubin Urine: NEGATIVE
Glucose, UA: NEGATIVE mg/dL
Hgb urine dipstick: NEGATIVE
Ketones, ur: NEGATIVE mg/dL
Nitrite: NEGATIVE
Protein, ur: NEGATIVE mg/dL
Specific Gravity, Urine: 1.019 (ref 1.005–1.030)
Urobilinogen, UA: 0.2 mg/dL (ref 0.0–1.0)
pH: 5 (ref 5.0–8.0)

## 2013-10-10 LAB — URINE MICROSCOPIC-ADD ON

## 2013-10-10 NOTE — ED Notes (Signed)
Completed in and out cath with Lanora ManisElizabeth, RN as my witness

## 2013-10-10 NOTE — ED Notes (Signed)
Pt placed on monitor upon arrival to room. Pt monitored by 5 lead, blood pressure, and pulse ox.

## 2013-10-10 NOTE — Discharge Instructions (Signed)
Increase clonazepam (Klonopin) dose to 1 mg, three times a day, as needed.

## 2013-10-10 NOTE — ED Notes (Addendum)
Family member reports pt has dementia but having increase in confusion for extended amount of time. ekg done at triage. Family reports recently diagnosed with UTI on Monday and started on antibiotics.

## 2013-10-10 NOTE — ED Provider Notes (Signed)
CSN: [id number]     Arrival date & time 10/10/13  1253 History   First MD Initiated Contact with Patient 10/10/13 1512     Chief Complaint  Patient presents with  . Altered Mental Status     (Consider location/radiation/quality/duration/timing/severity/associated sxs/prior Treatment) Patient is a 68 y.o. female presenting with altered mental status. The history is provided by a relative. The history is limited by the condition of the patient (Dementia).  Altered Mental Status Family to the ED because she has been acting out and been aggressive when she has been left at adult daycare. She goes to adult daycare 5 days a week. They have also noticed that she has been a little more aggressive at home. She has long-standing history of dementia. 3 days ago, she was diagnosed with a UTI and was placed on an antibiotic. As far as they know, she has not had any fever or chills. There's been no vomiting or diarrhea. He did not notice any change in her mentation. They do relate that she is not eating the way she should.  Past Medical History  Diagnosis Date  . 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 Chronic kidney disease   . Alzheimer disease   . DM (diabetes mellitus)   . HLD (hyperlipidemia)   . Memory deficits   . Psychosis   . Major depression   . Hypertension   . Migraine   . GERD (gastroesophageal reflux disease)   . Eczema   . Hypothyroidism   . Rhinitis   . Arthritis   . Dementia of the Alzheimer's type, with late onset, with depressive mood 05/01/2013  . 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 Abdominal hysterectomy    . Stomach surgery      ?  Marland Kitchen Thyroid surgery    . Dental surgery     Family History  Problem Relation Age of Onset  . Diabetes Mother   . Diabetes Brother   . Diabetes Brother   . Diabetes Sister   . Diabetes Sister   . Diabetes Sister   . Diabetes Sister   . Diabetes  Sister   . Diabetes Sister    History  Substance Use Topics  . Smoking status: Never Smoker   . Smokeless tobacco: Never Used  . Alcohol Use: No   OB History   Grav Para Term Preterm Abortions TAB SAB Ect Mult Living                 Review of Systems  Unable to perform ROS: Dementia      Allergies  Benazepril-hydrochlorothiazide  Home Medications   Prior to Admission medications   Medication Sig Start Date End Date Taking? Authorizing Provider  aspirin 81 MG tablet Take 81 mg by mouth daily.     Yes Historical Provider, MD  atorvastatin (LIPITOR) 20 MG tablet Take 20 mg by mouth daily.     Yes Historical Provider, MD  ciprofloxacin (CIPRO) 250 MG tablet Take 250 mg by mouth every 12 (twelve) hours.   Yes Historical Provider, MD  clonazePAM (KLONOPIN) 0.5 MG tablet Take 0.5 mg by mouth daily as needed for anxiety.   Yes Historical Provider, MD  donepezil (ARICEPT) 10 MG tablet Take 10 mg by mouth at bedtime.    Yes Historical Provider, MD  furosemide (LASIX) 20 MG tablet Take 20 mg by mouth daily as needed (leg swelling).  Yes Historical Provider, MD  levocetirizine (XYZAL) 5 MG tablet Take 5 mg by mouth daily.  04/18/13  Yes Historical Provider, MD  lisinopril (PRINIVIL,ZESTRIL) 10 MG tablet Take 10 mg by mouth daily.  04/05/13  Yes Historical Provider, MD  Memantine HCl ER (NAMENDA XR) 28 MG CP24 Take 28 mg by mouth daily.   Yes Historical Provider, MD  pioglitazone (ACTOS) 30 MG tablet Take 30 mg by mouth daily.     Yes Historical Provider, MD   BP 103/57  Pulse 60  Temp(Src) 98.5 F (36.9 C) (Rectal)  Resp 18  SpO2 100% Physical Exam  Nursing note and vitals reviewed.  68 year old female, resting comfortably and in no acute distress. Vital signs are normal. Oxygen saturation is 100%, which is normal. Head is normocephalic and atraumatic. PERRLA, EOMI. Oropharynx is clear. Neck is nontender and supple without adenopathy or JVD. Back is nontender and there is no CVA  tenderness. Lungs are clear without rales, wheezes, or rhonchi. Chest is nontender. Heart has regular rate and rhythm with 2/6 systolic ejection murmur heard along the left sternal border. Abdomen is soft, flat, nontender without masses or hepatosplenomegaly and peristalsis is normoactive. Extremities have 2+ edema, full range of motion is present. Skin is warm and dry without rash. Neurologic: She is awake and alert and oriented to person but not place or time, cranial nerves are intact, there are no motor or sensory deficits.  ED Course  Procedures (including critical care time) Labs Review Results for orders placed during the hospital encounter of 10/10/13  CBC WITH DIFFERENTIAL      Result Value Ref Range   WBC 5.7  4.0 - 10.5 K/uL   RBC 2.94 (*) 3.87 - 5.11 MIL/uL   Hemoglobin 8.8 (*) 12.0 - 15.0 g/dL   HCT 16.127.3 (*) 09.636.0 - 04.546.0 %   MCV 92.9  78.0 - 100.0 fL   MCH 29.9  26.0 - 34.0 pg   MCHC 32.2  30.0 - 36.0 g/dL   RDW 40.914.9  81.111.5 - 91.415.5 %   Platelets 184  150 - 400 K/uL   Neutrophils Relative % 58  43 - 77 %   Neutro Abs 3.3  1.7 - 7.7 K/uL   Lymphocytes Relative 34  12 - 46 %   Lymphs Abs 1.9  0.7 - 4.0 K/uL   Monocytes Relative 6  3 - 12 %   Monocytes Absolute 0.4  0.1 - 1.0 K/uL   Eosinophils Relative 2  0 - 5 %   Eosinophils Absolute 0.1  0.0 - 0.7 K/uL   Basophils Relative 0  0 - 1 %   Basophils Absolute 0.0  0.0 - 0.1 K/uL  BASIC METABOLIC PANEL      Result Value Ref Range   Sodium 140  137 - 147 mEq/L   Potassium 4.0  3.7 - 5.3 mEq/L   Chloride 100  96 - 112 mEq/L   CO2 23  19 - 32 mEq/L   Glucose, Bld 114 (*) 70 - 99 mg/dL   BUN 58 (*) 6 - 23 mg/dL   Creatinine, Ser 7.822.01 (*) 0.50 - 1.10 mg/dL   Calcium 9.2  8.4 - 95.610.5 mg/dL   GFR calc non Af Amer 25 (*) >90 mL/min   GFR calc Af Amer 29 (*) >90 mL/min   Anion gap 17 (*) 5 - 15  URINALYSIS, ROUTINE W REFLEX MICROSCOPIC      Result Value Ref Range   Color, Urine YELLOW  YELLOW   APPearance CLEAR  CLEAR    Specific Gravity, Urine 1.019  1.005 - 1.030   pH 5.0  5.0 - 8.0   Glucose, UA NEGATIVE  NEGATIVE mg/dL   Hgb urine dipstick NEGATIVE  NEGATIVE   Bilirubin Urine NEGATIVE  NEGATIVE   Ketones, ur NEGATIVE  NEGATIVE mg/dL   Protein, ur NEGATIVE  NEGATIVE mg/dL   Urobilinogen, UA 0.2  0.0 - 1.0 mg/dL   Nitrite NEGATIVE  NEGATIVE   Leukocytes, UA TRACE (*) NEGATIVE  HEPATIC FUNCTION PANEL      Result Value Ref Range   Total Protein 7.5  6.0 - 8.3 g/dL   Albumin 3.7  3.5 - 5.2 g/dL   AST 20  0 - 37 U/L   ALT 10  0 - 35 U/L   Alkaline Phosphatase 70  39 - 117 U/L   Total Bilirubin 0.4  0.3 - 1.2 mg/dL   Bilirubin, Direct <1.6<0.2  0.0 - 0.3 mg/dL   Indirect Bilirubin NOT CALCULATED  0.3 - 0.9 mg/dL  URINE MICROSCOPIC-ADD ON      Result Value Ref Range   Squamous Epithelial / LPF RARE  RARE   WBC, UA 0-2  <3 WBC/hpf   RBC / HPF 0-2  <3 RBC/hpf   Casts GRANULAR CAST (*) NEGATIVE   Imaging Review Dg Chest 2 View  10/10/2013   CLINICAL DATA:  Altered mental status  EXAM: CHEST  2 VIEW  COMPARISON:  November 04, 2009  FINDINGS: The heart size and mediastinal contours are stable. The aorta is tortuous. The lung volumes are low. There is no focal infiltrate, pulmonary edema, or pleural effusion. There is osteophytosis at the right fourth medial posterior rib unchanged. Both lungs are clear. The visualized skeletal structures are stable.  IMPRESSION: No active cardiopulmonary disease.   Electronically Signed   By: Sherian ReinWei-Chen  Lin M.D.   On: 10/10/2013 14:43     EKG Interpretation   Date/Time:  Thursday October 10 2013 13:19:04 EDT Ventricular Rate:  63 PR Interval:  154 QRS Duration: 84 QT Interval:  420 QTC Calculation: 429 R Axis:   6 Text Interpretation:  Normal sinus rhythm Minimal voltage criteria for  LVH, may be normal variant Borderline ECG When compared with ECG of  11/04/2009, No significant change was found Confirmed by [facility]  MD, Brodin Gelpi  ([id number]) on 10/10/2013 3:21:17 PM      MDM    Final diagnoses:  Dementia, with behavioral disturbance  Renal insufficiency    Dementia with behavioral problems. It is noted that she is on clonazepam but in a relatively low dose. Screening labs will be obtained including urinalysis to make sure that her urinary tract infection is being adequately treated but anticipate just increasing her clonazepam and an as-needed basis. Review of old records shows that she is followed by [facility] for dementia.  Urinalysis does not show active infection. Family is advised to increase her clonazepam as noted above.  Dione Boozeavid Fortunata Betty, MD 10/10/13 [phone]

## 2014-01-08 ENCOUNTER — Encounter (HOSPITAL_COMMUNITY): Payer: Self-pay | Admitting: Emergency Medicine

## 2014-01-08 ENCOUNTER — Inpatient Hospital Stay (HOSPITAL_COMMUNITY)
Admission: EM | Admit: 2014-01-08 | Discharge: 2014-01-11 | DRG: 689 | Disposition: A | Discharge status: Home / Self Care | Payer: Medicare PPO | Attending: Internal Medicine | Admitting: Internal Medicine

## 2014-01-08 DIAGNOSIS — E440 Moderate protein-calorie malnutrition: Secondary | ICD-10-CM

## 2014-01-08 DIAGNOSIS — E875 Hyperkalemia: Secondary | ICD-10-CM | POA: Diagnosis present

## 2014-01-08 DIAGNOSIS — R627 Adult failure to thrive: Secondary | ICD-10-CM

## 2014-01-08 DIAGNOSIS — R531 Weakness: Secondary | ICD-10-CM

## 2014-01-08 DIAGNOSIS — I10 Essential (primary) hypertension: Secondary | ICD-10-CM

## 2014-01-08 DIAGNOSIS — Z79899 Other long term (current) drug therapy: Secondary | ICD-10-CM

## 2014-01-08 DIAGNOSIS — R5381 Other malaise: Secondary | ICD-10-CM | POA: Diagnosis not present

## 2014-01-08 DIAGNOSIS — N390 Urinary tract infection, site not specified: Principal | ICD-10-CM

## 2014-01-08 DIAGNOSIS — E785 Hyperlipidemia, unspecified: Secondary | ICD-10-CM | POA: Diagnosis present

## 2014-01-08 DIAGNOSIS — G3183 Dementia with Lewy bodies: Secondary | ICD-10-CM | POA: Diagnosis present

## 2014-01-08 DIAGNOSIS — Z7982 Long term (current) use of aspirin: Secondary | ICD-10-CM

## 2014-01-08 DIAGNOSIS — F329 Major depressive disorder, single episode, unspecified: Secondary | ICD-10-CM | POA: Diagnosis present

## 2014-01-08 DIAGNOSIS — G9340 Encephalopathy, unspecified: Secondary | ICD-10-CM

## 2014-01-08 DIAGNOSIS — F0280 Dementia in other diseases classified elsewhere without behavioral disturbance: Secondary | ICD-10-CM | POA: Diagnosis present

## 2014-01-08 DIAGNOSIS — R4182 Altered mental status, unspecified: Secondary | ICD-10-CM

## 2014-01-08 DIAGNOSIS — Z6827 Body mass index (BMI) 27.0-27.9, adult: Secondary | ICD-10-CM

## 2014-01-08 DIAGNOSIS — I672 Cerebral atherosclerosis: Secondary | ICD-10-CM | POA: Diagnosis present

## 2014-01-08 DIAGNOSIS — I129 Hypertensive chronic kidney disease with stage 1 through stage 4 chronic kidney disease, or unspecified chronic kidney disease: Secondary | ICD-10-CM | POA: Diagnosis present

## 2014-01-08 DIAGNOSIS — E119 Type 2 diabetes mellitus without complications: Secondary | ICD-10-CM

## 2014-01-08 DIAGNOSIS — N183 Chronic kidney disease, stage 3 (moderate): Secondary | ICD-10-CM | POA: Diagnosis present

## 2014-01-08 DIAGNOSIS — G301 Alzheimer's disease with late onset: Secondary | ICD-10-CM

## 2014-01-08 DIAGNOSIS — E039 Hypothyroidism, unspecified: Secondary | ICD-10-CM | POA: Diagnosis present

## 2014-01-08 DIAGNOSIS — Z833 Family history of diabetes mellitus: Secondary | ICD-10-CM

## 2014-01-08 DIAGNOSIS — N179 Acute kidney failure, unspecified: Secondary | ICD-10-CM

## 2014-01-08 DIAGNOSIS — K219 Gastro-esophageal reflux disease without esophagitis: Secondary | ICD-10-CM | POA: Diagnosis present

## 2014-01-08 DIAGNOSIS — F0150 Vascular dementia without behavioral disturbance: Secondary | ICD-10-CM

## 2014-01-08 LAB — COMPREHENSIVE METABOLIC PANEL
ALBUMIN: 4.1 g/dL (ref 3.5–5.2)
ALT: 12 U/L (ref 0–35)
AST: 19 U/L (ref 0–37)
Alkaline Phosphatase: 86 U/L (ref 39–117)
Anion gap: 14 (ref 5–15)
BILIRUBIN TOTAL: 0.5 mg/dL (ref 0.3–1.2)
BUN: 32 mg/dL — ABNORMAL HIGH (ref 6–23)
CO2: 25 meq/L (ref 19–32)
CREATININE: 1.68 mg/dL — AB (ref 0.50–1.10)
Calcium: 9.4 mg/dL (ref 8.4–10.5)
Chloride: 99 mEq/L (ref 96–112)
GFR, EST AFRICAN AMERICAN: 36 mL/min — AB (ref >90)
GFR, EST NON AFRICAN AMERICAN: 31 mL/min — AB (ref >90)
Glucose, Bld: 105 mg/dL — ABNORMAL HIGH (ref 70–99)
Potassium: 5.2 mEq/L (ref 3.7–5.3)
Sodium: 138 mEq/L (ref 137–147)
Total Protein: 8.7 g/dL — ABNORMAL HIGH (ref 6.0–8.3)

## 2014-01-08 LAB — CBC WITH DIFFERENTIAL/PLATELET
BASOS ABS: 0.1 10*3/uL (ref 0.0–0.1)
BASOS PCT: 1 % (ref 0–1)
Eosinophils Absolute: 0.3 10*3/uL (ref 0.0–0.7)
Eosinophils Relative: 4 % (ref 0–5)
HCT: 33.6 % — ABNORMAL LOW (ref 36.0–46.0)
Hemoglobin: 11 g/dL — ABNORMAL LOW (ref 12.0–15.0)
Lymphocytes Relative: 32 % (ref 12–46)
Lymphs Abs: 2.3 10*3/uL (ref 0.7–4.0)
MCH: 30 pg (ref 26.0–34.0)
MCHC: 32.7 g/dL (ref 30.0–36.0)
MCV: 91.6 fL (ref 78.0–100.0)
Monocytes Absolute: 0.5 10*3/uL (ref 0.1–1.0)
Monocytes Relative: 6 % (ref 3–12)
NEUTROS ABS: 4.2 10*3/uL (ref 1.7–7.7)
NEUTROS PCT: 57 % (ref 43–77)
PLATELETS: 219 10*3/uL (ref 150–400)
RBC: 3.67 MIL/uL — ABNORMAL LOW (ref 3.87–5.11)
RDW: 13.7 % (ref 11.5–15.5)
WBC: 7.2 10*3/uL (ref 4.0–10.5)

## 2014-01-08 MED ORDER — SODIUM CHLORIDE 0.9 % IV BOLUS (SEPSIS)
1000.0000 mL | Freq: Once | INTRAVENOUS | Status: AC
  Administered 2014-01-09: 1000 mL via INTRAVENOUS

## 2014-01-08 NOTE — ED Notes (Signed)
Per family pt has been refusing to eat for a while now. sts also she has been having difficulty walking.

## 2014-01-08 NOTE — ED Notes (Signed)
phelb back to draw.

## 2014-01-08 NOTE — ED Notes (Signed)
Phlebotomy called; 2 tries to get blood failed.

## 2014-01-09 ENCOUNTER — Emergency Department (HOSPITAL_COMMUNITY): Payer: Medicare PPO

## 2014-01-09 ENCOUNTER — Encounter (HOSPITAL_COMMUNITY): Payer: Self-pay

## 2014-01-09 ENCOUNTER — Other Ambulatory Visit (HOSPITAL_COMMUNITY): Payer: Medicare PPO

## 2014-01-09 DIAGNOSIS — Z79899 Other long term (current) drug therapy: Secondary | ICD-10-CM | POA: Diagnosis not present

## 2014-01-09 DIAGNOSIS — E440 Moderate protein-calorie malnutrition: Secondary | ICD-10-CM

## 2014-01-09 DIAGNOSIS — G9340 Encephalopathy, unspecified: Secondary | ICD-10-CM | POA: Diagnosis present

## 2014-01-09 DIAGNOSIS — I129 Hypertensive chronic kidney disease with stage 1 through stage 4 chronic kidney disease, or unspecified chronic kidney disease: Secondary | ICD-10-CM | POA: Diagnosis present

## 2014-01-09 DIAGNOSIS — N390 Urinary tract infection, site not specified: Secondary | ICD-10-CM | POA: Diagnosis present

## 2014-01-09 DIAGNOSIS — F0150 Vascular dementia without behavioral disturbance: Secondary | ICD-10-CM | POA: Diagnosis present

## 2014-01-09 DIAGNOSIS — N183 Chronic kidney disease, stage 3 unspecified: Secondary | ICD-10-CM | POA: Diagnosis present

## 2014-01-09 DIAGNOSIS — N179 Acute kidney failure, unspecified: Secondary | ICD-10-CM

## 2014-01-09 DIAGNOSIS — R4182 Altered mental status, unspecified: Secondary | ICD-10-CM | POA: Insufficient documentation

## 2014-01-09 DIAGNOSIS — Z833 Family history of diabetes mellitus: Secondary | ICD-10-CM | POA: Diagnosis not present

## 2014-01-09 DIAGNOSIS — I672 Cerebral atherosclerosis: Secondary | ICD-10-CM | POA: Diagnosis present

## 2014-01-09 DIAGNOSIS — E039 Hypothyroidism, unspecified: Secondary | ICD-10-CM | POA: Diagnosis present

## 2014-01-09 DIAGNOSIS — G309 Alzheimer's disease, unspecified: Secondary | ICD-10-CM

## 2014-01-09 DIAGNOSIS — Z6827 Body mass index (BMI) 27.0-27.9, adult: Secondary | ICD-10-CM | POA: Diagnosis not present

## 2014-01-09 DIAGNOSIS — G3183 Dementia with Lewy bodies: Secondary | ICD-10-CM | POA: Diagnosis present

## 2014-01-09 DIAGNOSIS — G301 Alzheimer's disease with late onset: Secondary | ICD-10-CM | POA: Diagnosis present

## 2014-01-09 DIAGNOSIS — E119 Type 2 diabetes mellitus without complications: Secondary | ICD-10-CM

## 2014-01-09 DIAGNOSIS — E785 Hyperlipidemia, unspecified: Secondary | ICD-10-CM | POA: Diagnosis present

## 2014-01-09 DIAGNOSIS — F329 Major depressive disorder, single episode, unspecified: Secondary | ICD-10-CM | POA: Diagnosis present

## 2014-01-09 DIAGNOSIS — G9349 Other encephalopathy: Secondary | ICD-10-CM | POA: Diagnosis present

## 2014-01-09 DIAGNOSIS — R627 Adult failure to thrive: Secondary | ICD-10-CM | POA: Diagnosis present

## 2014-01-09 DIAGNOSIS — I10 Essential (primary) hypertension: Secondary | ICD-10-CM

## 2014-01-09 DIAGNOSIS — E875 Hyperkalemia: Secondary | ICD-10-CM | POA: Diagnosis present

## 2014-01-09 DIAGNOSIS — Z7982 Long term (current) use of aspirin: Secondary | ICD-10-CM | POA: Diagnosis not present

## 2014-01-09 DIAGNOSIS — F3289 Other specified depressive episodes: Secondary | ICD-10-CM | POA: Diagnosis present

## 2014-01-09 DIAGNOSIS — K219 Gastro-esophageal reflux disease without esophagitis: Secondary | ICD-10-CM | POA: Diagnosis present

## 2014-01-09 DIAGNOSIS — R531 Weakness: Secondary | ICD-10-CM

## 2014-01-09 DIAGNOSIS — F0280 Dementia in other diseases classified elsewhere without behavioral disturbance: Secondary | ICD-10-CM

## 2014-01-09 DIAGNOSIS — R5381 Other malaise: Secondary | ICD-10-CM | POA: Diagnosis present

## 2014-01-09 LAB — HEMOGLOBIN A1C
Hgb A1c MFr Bld: 6.2 % — ABNORMAL HIGH (ref <5.7)
MEAN PLASMA GLUCOSE: 131 mg/dL — AB (ref <117)

## 2014-01-09 LAB — URINALYSIS, ROUTINE W REFLEX MICROSCOPIC
Bilirubin Urine: NEGATIVE
Glucose, UA: NEGATIVE mg/dL
Hgb urine dipstick: NEGATIVE
Ketones, ur: 15 mg/dL — AB
NITRITE: NEGATIVE
Protein, ur: NEGATIVE mg/dL
SPECIFIC GRAVITY, URINE: 1.023 (ref 1.005–1.030)
UROBILINOGEN UA: 0.2 mg/dL (ref 0.0–1.0)
pH: 5 (ref 5.0–8.0)

## 2014-01-09 LAB — CBC
HCT: 32.9 % — ABNORMAL LOW (ref 36.0–46.0)
Hemoglobin: 10.6 g/dL — ABNORMAL LOW (ref 12.0–15.0)
MCH: 30.1 pg (ref 26.0–34.0)
MCHC: 32.2 g/dL (ref 30.0–36.0)
MCV: 93.5 fL (ref 78.0–100.0)
Platelets: 193 10*3/uL (ref 150–400)
RBC: 3.52 MIL/uL — ABNORMAL LOW (ref 3.87–5.11)
RDW: 13.4 % (ref 11.5–15.5)
WBC: 6.9 10*3/uL (ref 4.0–10.5)

## 2014-01-09 LAB — URINE MICROSCOPIC-ADD ON

## 2014-01-09 LAB — GLUCOSE, CAPILLARY
GLUCOSE-CAPILLARY: 110 mg/dL — AB (ref 70–99)
GLUCOSE-CAPILLARY: 124 mg/dL — AB (ref 70–99)
GLUCOSE-CAPILLARY: 90 mg/dL (ref 70–99)
Glucose-Capillary: 124 mg/dL — ABNORMAL HIGH (ref 70–99)
Glucose-Capillary: 137 mg/dL — ABNORMAL HIGH (ref 70–99)
Glucose-Capillary: 127 mg/dL — ABNORMAL HIGH (ref 70–99)

## 2014-01-09 LAB — BASIC METABOLIC PANEL
ANION GAP: 10 (ref 5–15)
BUN: 28 mg/dL — ABNORMAL HIGH (ref 6–23)
CHLORIDE: 106 meq/L (ref 96–112)
CO2: 26 mEq/L (ref 19–32)
Calcium: 9 mg/dL (ref 8.4–10.5)
Creatinine, Ser: 1.52 mg/dL — ABNORMAL HIGH (ref 0.50–1.10)
GFR calc non Af Amer: 35 mL/min — ABNORMAL LOW (ref >90)
GFR, EST AFRICAN AMERICAN: 40 mL/min — AB (ref >90)
Glucose, Bld: 116 mg/dL — ABNORMAL HIGH (ref 70–99)
POTASSIUM: 5.3 meq/L (ref 3.7–5.3)
Sodium: 142 mEq/L (ref 137–147)

## 2014-01-09 LAB — PREALBUMIN: PREALBUMIN: 23.6 mg/dL (ref 17.0–34.0)

## 2014-01-09 LAB — CK: Total CK: 106 U/L (ref 7–177)

## 2014-01-09 MED ORDER — DEXTROSE 5 % IV SOLN
1.0000 g | Freq: Once | INTRAVENOUS | Status: AC
  Administered 2014-01-09: 1 g via INTRAVENOUS
  Filled 2014-01-09: qty 10

## 2014-01-09 MED ORDER — ENOXAPARIN SODIUM 30 MG/0.3ML ~~LOC~~ SOLN
30.0000 mg | SUBCUTANEOUS | Status: DC
  Administered 2014-01-09 – 2014-01-10 (×2): 30 mg via SUBCUTANEOUS
  Filled 2014-01-09 (×2): qty 0.3

## 2014-01-09 MED ORDER — SODIUM CHLORIDE 0.9 % IV SOLN
INTRAVENOUS | Status: DC
  Administered 2014-01-09 – 2014-01-10 (×4): via INTRAVENOUS

## 2014-01-09 MED ORDER — ACETAMINOPHEN 325 MG PO TABS: 650.0000 mg | ORAL_TABLET | Freq: Four times a day (QID) | ORAL | Status: DC | PRN

## 2014-01-09 MED ORDER — ASPIRIN 81 MG PO CHEW
81.0000 mg | CHEWABLE_TABLET | Freq: Every day | ORAL | Status: DC
  Administered 2014-01-09 – 2014-01-11 (×3): 81 mg via ORAL
  Filled 2014-01-09 (×6): qty 1

## 2014-01-09 MED ORDER — HYDROMORPHONE HCL 1 MG/ML IJ SOLN: 0.5000 mg | INTRAMUSCULAR | Status: DC | PRN

## 2014-01-09 MED ORDER — MEMANTINE HCL ER 28 MG PO CP24
28.0000 mg | ORAL_CAPSULE | Freq: Every day | ORAL | Status: DC
  Administered 2014-01-09 – 2014-01-11 (×3): 28 mg via ORAL
  Filled 2014-01-09 (×4): qty 28

## 2014-01-09 MED ORDER — INSULIN ASPART 100 UNIT/ML ~~LOC~~ SOLN
0.0000 [IU] | SUBCUTANEOUS | Status: DC
  Administered 2014-01-09 (×4): 1 [IU] via SUBCUTANEOUS
  Administered 2014-01-10: 21:00:00 via SUBCUTANEOUS
  Administered 2014-01-10: 1 [IU] via SUBCUTANEOUS

## 2014-01-09 MED ORDER — DEXTROSE 5 % IV SOLN
1.0000 g | INTRAVENOUS | Status: DC
  Administered 2014-01-10 – 2014-01-11 (×2): 1 g via INTRAVENOUS
  Filled 2014-01-09 (×2): qty 10

## 2014-01-09 MED ORDER — LORATADINE 10 MG PO TABS
10.0000 mg | ORAL_TABLET | Freq: Every day | ORAL | Status: DC
  Administered 2014-01-09 – 2014-01-11 (×3): 10 mg via ORAL
  Filled 2014-01-09 (×3): qty 1

## 2014-01-09 MED ORDER — ONDANSETRON HCL 4 MG PO TABS: 4.0000 mg | ORAL_TABLET | Freq: Four times a day (QID) | ORAL | Status: DC | PRN

## 2014-01-09 MED ORDER — ALUM & MAG HYDROXIDE-SIMETH 200-200-20 MG/5ML PO SUSP: 30.0000 mL | Freq: Four times a day (QID) | ORAL | Status: DC | PRN

## 2014-01-09 MED ORDER — ACETAMINOPHEN 650 MG RE SUPP: 650.0000 mg | Freq: Four times a day (QID) | RECTAL | Status: DC | PRN

## 2014-01-09 MED ORDER — OXYCODONE HCL 5 MG PO TABS: 5.0000 mg | ORAL_TABLET | ORAL | Status: DC | PRN

## 2014-01-09 MED ORDER — CLONAZEPAM 0.5 MG PO TABS: 0.5000 mg | ORAL_TABLET | Freq: Two times a day (BID) | ORAL | Status: DC | PRN

## 2014-01-09 MED ORDER — HYDRALAZINE HCL 20 MG/ML IJ SOLN
2.0000 mg | Freq: Four times a day (QID) | INTRAMUSCULAR | Status: DC | PRN
  Administered 2014-01-09: 2 mg via INTRAVENOUS
  Filled 2014-01-09: qty 1

## 2014-01-09 MED ORDER — OXYCODONE HCL 5 MG PO TABS: 5.0000 mg | ORAL_TABLET | Freq: Three times a day (TID) | ORAL | Status: DC | PRN

## 2014-01-09 MED ORDER — CEFTRIAXONE SODIUM 1 G IJ SOLR: 1.0000 g | INTRAMUSCULAR | Status: DC

## 2014-01-09 MED ORDER — HYDRALAZINE HCL 20 MG/ML IJ SOLN: 5.0000 mg | Freq: Four times a day (QID) | INTRAMUSCULAR | Status: DC | PRN

## 2014-01-09 MED ORDER — OMEGA-3-ACID ETHYL ESTERS 1 G PO CAPS
1.0000 g | ORAL_CAPSULE | Freq: Every day | ORAL | Status: DC
  Administered 2014-01-09 – 2014-01-11 (×3): 1 g via ORAL
  Filled 2014-01-09 (×3): qty 1

## 2014-01-09 MED ORDER — ATORVASTATIN CALCIUM 10 MG PO TABS
20.0000 mg | ORAL_TABLET | Freq: Every day | ORAL | Status: DC
  Administered 2014-01-09 – 2014-01-11 (×3): 20 mg via ORAL
  Filled 2014-01-09 (×3): qty 2

## 2014-01-09 MED ORDER — VENLAFAXINE HCL 37.5 MG PO TABS
75.0000 mg | ORAL_TABLET | Freq: Two times a day (BID) | ORAL | Status: DC
  Administered 2014-01-09 – 2014-01-11 (×5): 75 mg via ORAL
  Filled 2014-01-09 (×5): qty 2

## 2014-01-09 MED ORDER — ONDANSETRON HCL 4 MG/2ML IJ SOLN: 4.0000 mg | Freq: Four times a day (QID) | INTRAMUSCULAR | Status: DC | PRN

## 2014-01-09 NOTE — Evaluation (Signed)
Clinical/Bedside Swallow Evaluation Patient Details  Name: [name] [name] MRN: [id number] Date of Birth: 1946/03/02  Today's Date: 01/09/2014 Time: 4098-1191 SLP Time Calculation (min): 48 min  Past Medical History:  Past Medical History  Diagnosis Date  . 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 Chronic kidney disease   . Alzheimer disease   . DM (diabetes mellitus)   . HLD (hyperlipidemia)   . Memory deficits   . Psychosis   . Major depression   . Hypertension   . Migraine   . GERD (gastroesophageal reflux disease)   . Eczema   . Hypothyroidism   . Rhinitis   . Arthritis   . Dementia of the Alzheimer's type, with late onset, with depressive mood 05/01/2013  . 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 Abdominal hysterectomy    . Stomach surgery      ?  Marland Kitchen Thyroid surgery    . Dental surgery     HPI:  68 year old female admitted 01/08/14 due to decreased po intake with subsequent increase in confusion and weakness. PMH signfiicant for dementia, DM, HTN, GERD. No prior ST intervention found   Assessment / Plan / Recommendation Clinical Impression  Pt tolerates thin and thick consistencies without overt s/s aspiration or clinical indication of airway compromise. Husband was present during evaluation, and indicated pt frequently refuses to eat, but will accept Ensure/Boost supplements. At this time, full liquid diet, preferably sweet in taste, is recommended.  Given advanced dementia, it is highly likely that pt's refusal to eat is more related to cognitive deficit, rather than sensory or motor deficit.  Lung sounds are currently clear, CXR reveals no acute cardiopulmonary disease. ST will follow briefly to assess diet tolerance and for education.of family. Precautions posted at [facility]    Aspiration Risk  Mild    Diet Recommendation  (full liquids)   Liquid  Administration via: Straw Medication Administration: Crushed with puree Supervision: Staff to assist with self feeding Compensations: Slow rate;Small sips/bites Postural Changes and/or Swallow Maneuvers: Seated upright 90 degrees;Upright 30-60 min after meal    Other  Recommendations Oral Care Recommendations: Oral care BID   Follow Up Recommendations  None    Frequency and Duration min 1 x/week  1 week   Pertinent Vitals/Pain VSS, no pain evident    SLP Swallow Goals  diet tolerance, family education   Swallow Study Prior Functional Status   No prior history of dysphagia, recent refusal of solid consistencies and acceptance of only sweet liquids, consistent with advancing dementia.    General Date of Onset: 01/08/14 HPI: 68 year old female admitted 01/08/14 due to decreased po intake with subsequent increase in confusion and weakness. PMH signfiicant for dementia, DM, HTN, GERD. No prior ST intervention found Type of Study: Bedside swallow evaluation Previous Swallow Assessment: n/a Diet Prior to this Study: NPO Temperature Spikes Noted: No Respiratory Status: Room air History of Recent Intubation: No Behavior/Cognition: Alert;Cooperative;Pleasant mood;Decreased sustained attention;Distractible;Requires cueing;Doesn't follow directions Oral Cavity - Dentition: Edentulous Self-Feeding Abilities: Needs assist Patient Positioning: Upright in bed Baseline Vocal Quality: Clear Volitional Cough: Cognitively unable to elicit Volitional Swallow: Unable to elicit    Oral/Motor/Sensory Function Overall Oral Motor/Sensory Function: Appears within functional limits for tasks assessed   Ice Chips Ice chips: Not tested   Thin Liquid Thin Liquid: Within functional limits Presentation: Straw    Nectar Thick Nectar Thick  Liquid: Not tested   Honey Thick Honey Thick Liquid: Not tested   Puree Puree: Within functional limits Presentation: Spoon   Solid   GO    Solid: Not tested       [name] [name], [facility] HollyMSP, CCC-SLP [phone]  [name] [name], [name] [name] 01/09/2014,3:48 PM

## 2014-01-09 NOTE — Progress Notes (Signed)
Patient arrived to 4N26 [place] only but pleasantly confused and able to follow some commands. Telemetry was placed and call bell is by her side. Family has left for the morning. Vital signs were taken. Pt is resting comfortably. Will continue to monitor closely. Enya Bureau, [place] ScrapeSarah E, RN

## 2014-01-09 NOTE — Progress Notes (Signed)
CARE MANAGEMENT NOTE 01/09/2014  Patient:  [name] [name],[name] [name]   Account Number:  [id number]  Date Initiated:  01/09/2014  Documentation initiated by:  Jiles CrockerHANDLER,Joab Carden  Subjective/Objective Assessment:   ADMITTED WITH ENCEPHALOPATHY     Action/Plan:   CM FOLLOWING FOR DCP   Anticipated DC Date:  01/11/2014   Anticipated DC Plan:  POSSIBLY HOME W HOME HEALTH SERVICES     DC Planning Services  CM consult         Status of service:  In process, will continue to follow Medicare Important Message given?   (If response is "NO", the following Medicare IM given date fields will be blank)  Per UR Regulation:  Reviewed for med. necessity/level of care/duration of stay  Comments:  10/1/2015Abelino Derrick- [name] RN,BSN,MHA [phone]

## 2014-01-09 NOTE — Progress Notes (Signed)
INITIAL NUTRITION ASSESSMENT  DOCUMENTATION CODES Per approved criteria  -Non-severe (moderate) malnutrition in the context of chronic illness  Pt meets criteria for MODERATE MALNUTRITION in the context of CHRONIC ILLNESS as evidenced by mild muscle wasting and moderate loss of subcutaneous fat per physical exam and estimated energy intake < 75% of estimated energy needs for > 1 month.  INTERVENTION: Provide Ensure Complete 3-4 times daily when diet is advanced Provide Multivitamin with minerals daily  NUTRITION DIAGNOSIS: Inadequate oral intake related to advanced dementia as evidenced by 10% weight loss.   Goal: Pt to meet >/= 90% of their estimated nutrition needs   Monitor:  PO intake, deit advancement, weight trend, labs  Reason for Assessment: Consult  68 y.o. female  Admitting Dx: Acute encephalopathy  ASSESSMENT: 68 y.o. female with a history of Dementia, DM2, HTN, Hyperlipidemia who was brought to the ED due to increased confusion and poor intak e fo foods and liquids over the past 3 days. Her family is at he bedside and give the history and report that she has had increased weakness and difficulty walking as well over the past 3 days.   Pt is currently NPO. Assessed by SLP this afternoon who recommended Full liquid diet. Pt unable to provide any history; history provided by pt's husband at bedside. Per husband pt started eating poorly about 6 weeks ago. For the past 6 weeks she has only been eating a few bites of food at each meal with intervals of going 2-3 days without eating anything. Usually he can still get her to drink 2 bottles of Ensure and some orange juice on days she refuses to eat but, for the past 3 days patient has been refusing everything. Husband is unsure if pt has lost any weight. He reports that since patient started eating poorly she has also lost her ability to walk. Husband alsp report that patient lost her dentures so, she no longer has any teeth.    Nutrition Focused Physical Exam:  Subcutaneous Fat:  Orbital Region: wnl Upper Arm Region: moderate wasting Thoracic and Lumbar Region: NA  Muscle:  Temple Region: mild wasting Clavicle Bone Region: mild wasting Clavicle and Acromion Bone Region: wnl Scapular Bone Region: NA Dorsal Hand: mild wasting Patellar Region: mild wasting Anterior Thigh Region: mild wasting Posterior Calf Region: mild wasting  Edema: none noted   Height: Ht Readings from Last 1 Encounters:  01/09/14 5\' 5"  (1.651 m)    Weight: Wt Readings from Last 1 Encounters:  01/09/14 164 lb 1.6 oz (74.435 kg)    Ideal Body Weight: 125 lbs  % Ideal Body Weight: 131%  Wt Readings from Last 10 Encounters:  01/09/14 164 lb 1.6 oz (74.435 kg)  05/01/13 184 lb (83.462 kg)  08/10/10 161 lb (73.029 kg)  12/28/10 166 lb (75.297 kg)    Usual Body Weight: unknown  % Usual Body Weight: NA  BMI:  Body mass index is 27.31 kg/(m^2).  Estimated Nutritional Needs: Kcal: 1750-1950 Protein: 75-85 grams Fluid: 1.7-1.9 L/day  Skin: intact  Diet Order: NPO  EDUCATION NEEDS: -No education needs identified at this time   Intake/Output Summary (Last 24 hours) at 01/09/14 1530 Last data filed at 01/09/14 1300  Gross per 24 hour  Intake      0 ml  Output      0 ml  Net      0 ml    Last BM: PTA  Labs:   Recent Labs Lab 01/08/14 2242 01/09/14 0602  NA 138 142  K 5.2 5.3  CL 99 106  CO2 25 26  BUN 32* 28*  CREATININE 1.68* 1.52*  CALCIUM 9.4 9.0  GLUCOSE 105* 116*    CBG (last 3)   Recent Labs  01/09/14 0400 01/09/14 0957 01/09/14 1202  GLUCAP 110* 124* 137*    Scheduled Meds: . aspirin  81 mg Oral Daily  . atorvastatin  20 mg Oral Daily  . [START ON 01/10/2014] cefTRIAXone (ROCEPHIN)  IV  1 g Intravenous Q24H  . enoxaparin (LOVENOX) injection  30 mg Subcutaneous Q24H  . insulin aspart  0-9 Units Subcutaneous 6 times per day  . loratadine  10 mg Oral Daily  . Memantine HCl ER   28 mg Oral Daily  . omega-3 acid ethyl esters  1 g Oral Daily  . venlafaxine  75 mg Oral BID    Continuous Infusions: . sodium chloride 75 mL/hr at 01/09/14 0357    Past Medical History  Diagnosis Date  . 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 Chronic kidney disease   . Alzheimer disease   . DM (diabetes mellitus)   . HLD (hyperlipidemia)   . Memory deficits   . Psychosis   . Major depression   . Hypertension   . Migraine   . GERD (gastroesophageal reflux disease)   . Eczema   . Hypothyroidism   . Rhinitis   . Arthritis   . Dementia of the Alzheimer's type, with late onset, with depressive mood 05/01/2013  . 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 Abdominal hysterectomy    . Stomach surgery      ?  Marland Kitchen Thyroid surgery    . Dental surgery      Ian Malkin RD, LDN Inpatient Clinical Dietitian Pager: [phone] After Hours Pager: [phone]

## 2014-01-09 NOTE — H&P (Signed)
[facility] Admission History and Physical       [name] [name] ZOX:[id number] DOB: 08-Jul-1945 DOA: 01/08/2014  Referring physician:  EDP PCP: Dorrene German, MD  Specialists:   Chief Complaint: Weakness, Increased Confusion  HPI: [name] [name] is a 68 y.o. female with a history of Dementia, DM2, HTN, Hyperlipidemia who was brought to the ED due to increased confusion and poor intak e fo foods and liquids over the past 3 days.  Her family is at he bedside and give the history and report that she has had increased weakness and difficulty walking as well over the past 3 days.   She has not had fevers or chills per the family.    She has had a poor appetite for months, and at one time she had been placed on a medication for her appetite (Possibly Megace), but the medication was not refilled because the Insurance would not approve it.   She was evaluated in the ED and was found to have a UTI and was placed on IV Rocephin and referred for admission.      Review of Systems:  Constitutional:  +Weight Loss, No Weight Gain, Night Sweats, Fevers, Chills, Dizziness, Fatigue, +Generalized Weakness HEENT: No Headaches, Difficulty Swallowing,Tooth/Dental Problems,Sore Throat,  No Sneezing, Rhinitis, Ear Ache, Nasal Congestion, or Post Nasal Drip,  Cardio-vascular:  No Chest pain, Orthopnea, PND, Edema in Lower Extremities, Anasarca, Dizziness, Palpitations  Resp: No Dyspnea, No DOE, No Cough, No Hemoptysis, No Wheezing.    GI: No Heartburn, Indigestion, Abdominal Pain, Nausea, Vomiting, Diarrhea, Hematemesis, Hematochezia, Melena, Change in Bowel Habits,  +Loss of Appetite  GU: No Dysuria, Change in Color of Urine, No Urgency or Frequency, No Flank pain.  Musculoskeletal: No Joint Pain or Swelling, No Decreased Range of Motion, No Back Pain.  Neurologic: No Syncope, No Seizures, Muscle Weakness, Paresthesia, Vision Disturbance or Loss, No Diplopia, No Vertigo, +Difficulty Walking,  Skin:  No Rash or Lesions. Psych: No Change in Mood or Affect, No Depression or Anxiety, No Memory loss, No Confusion, or Hallucinations   Past Medical History  Diagnosis Date  . 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 Chronic kidney disease   . Alzheimer disease   . DM (diabetes mellitus)   . HLD (hyperlipidemia)   . Memory deficits   . Psychosis   . Major depression   . Hypertension   . Migraine   . GERD (gastroesophageal reflux disease)   . Eczema   . Hypothyroidism   . Rhinitis   . Arthritis   . Dementia of the Alzheimer's type, with late onset, with depressive mood 05/01/2013  . 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 Abdominal hysterectomy    . Stomach surgery      ?  Marland Kitchen Thyroid surgery    . Dental surgery       Prior to Admission medications   Medication Sig Start Date End Date Taking? Authorizing Provider  aspirin 81 MG tablet Take 81 mg by mouth daily.     Yes Historical Provider, MD  atorvastatin (LIPITOR) 20 MG tablet Take 20 mg by mouth daily.     Yes Historical Provider, MD  clonazePAM (KLONOPIN) 0.5 MG tablet Take 0.5 mg by mouth daily as needed for anxiety.   Yes Historical Provider, MD  ergocalciferol (VITAMIN D2) 50000 UNITS capsule Take 50,000 Units by mouth once a week. No specific day  Yes Historical Provider, MD  levocetirizine (XYZAL) 5 MG tablet Take 5 mg by mouth daily.  04/18/13  Yes Historical Provider, MD  lisinopril (PRINIVIL,ZESTRIL) 10 MG tablet Take 10 mg by mouth daily.  04/05/13  Yes Historical Provider, MD  Memantine HCl ER (NAMENDA XR) 28 MG CP24 Take 28 mg by mouth daily.   Yes Historical Provider, MD  omega-3 acid ethyl esters (LOVAZA) 1 G capsule Take 1 g by mouth daily.   Yes Historical Provider, MD  venlafaxine (EFFEXOR) 75 MG tablet Take 75 mg by mouth 2 (two) times daily.   Yes Historical Provider, MD    Allergies  Allergen Reactions  .  Benazepril-Hydrochlorothiazide Other (See Comments)    unknown    Social History:  reports that she has never smoked. She has never used smokeless tobacco. She reports that she does not drink alcohol or use illicit drugs.     Family History  Problem Relation Age of Onset  . Diabetes Mother   . Diabetes Brother   . Diabetes Brother   . Diabetes Sister   . Diabetes Sister   . Diabetes Sister   . Diabetes Sister   . Diabetes Sister   . Diabetes Sister        Physical Exam:  GEN:  Confused Thin Elderly  68 y.o. African American female  examined  and in no acute distress; cooperative with exam Filed Vitals:   01/08/14 1708 01/08/14 2208 01/09/14 0026 01/09/14 0130  BP: 116/61 138/57 172/64 146/63  Pulse: 99 54 60 57  Temp: 98.8 F (37.1 C)  97.4 F (36.3 C)   Resp: 18 18 24 14   SpO2: 99% 100% 100% 100%   Blood pressure 146/63, pulse 57, temperature 97.4 F (36.3 C), resp. rate 14, SpO2 100.00%. PSYCH: She is alert and oriented x1;  HEENT: Normocephalic and Atraumatic, Mucous membranes pink; PERRLA; EOM intact; Fundi:  Benign;  No scleral icterus, Nares: Patent, Oropharynx: Clear, Edentulous,    Neck:  FROM, No Cervical Lymphadenopathy nor Thyromegaly or Carotid Bruit; No JVD; Breasts:: Not examined CHEST WALL: No tenderness CHEST: Normal respiration, clear to auscultation bilaterally HEART: Regular rate and rhythm; no murmurs rubs or gallops BACK: No kyphosis or scoliosis; No CVA tenderness ABDOMEN: Positive Bowel Sounds, Scaphoid, Soft Non-Tender; No Masses, No Organomegaly. Rectal Exam: Not done EXTREMITIES: No Cyanosis, Clubbing, or Edema; No Ulcerations. Genitalia: not examined PULSES: 2+ and symmetric SKIN: Normal hydration no rash or ulceration CNS:  Alert and Oriented x 1, No Focal Deficits, Generalized Weakness Vascular: pulses palpable throughout    Labs on Admission:  Basic Metabolic Panel:  Recent Labs Lab 01/08/14 2242  NA 138  K 5.2  CL 99  CO2  25  GLUCOSE 105*  BUN 32*  CREATININE 1.68*  CALCIUM 9.4   Liver Function Tests:  Recent Labs Lab 01/08/14 2242  AST 19  ALT 12  ALKPHOS 86  BILITOT 0.5  PROT 8.7*  ALBUMIN 4.1   No results found for this basename: LIPASE, AMYLASE,  in the last 168 hours No results found for this basename: AMMONIA,  in the last 168 hours CBC:  Recent Labs Lab 01/08/14 2242  WBC 7.2  NEUTROABS 4.2  HGB 11.0*  HCT 33.6*  MCV 91.6  PLT 219   Cardiac Enzymes:  Recent Labs Lab 01/08/14 2242  CKTOTAL 106    BNP (last 3 results) No results found for this basename: PROBNP,  in the last 8760 hours CBG: No results found for this  basename: GLUCAP,  in the last 168 hours  Radiological Exams on Admission: Dg Chest 2 View  01/09/2014   CLINICAL DATA:  Anorexia, difficulty walking  EXAM: CHEST  2 VIEW  COMPARISON:  10/10/2013  FINDINGS: Lungs are essentially clear. No focal consolidation. No pleural effusion or pneumothorax.  The heart is top-normal in size.  Visualized osseous structures are within normal limits.  IMPRESSION: No evidence of acute cardiopulmonary disease.   Electronically Signed   By: Charline Bills M.D.   On: 01/09/2014 00:40   Ct Head Wo Contrast  01/09/2014   CLINICAL DATA:  Anorexia.  Difficulty walking.  EXAM: CT HEAD WITHOUT CONTRAST  TECHNIQUE: Contiguous axial images were obtained from the base of the skull through the vertex without intravenous contrast.  COMPARISON:  MRI brain 05/16/2013  FINDINGS: Diffuse cerebral atrophy. Ventricular dilatation likely due to central atrophy. Low-attenuation changes in the deep white matter consistent with small vessel ischemia. No mass effect or midline shift. No abnormal extra-axial fluid collections. Gray-white matter junctions are distinct. Basal cisterns are not effaced. No evidence of acute intracranial hemorrhage. No depressed skull fractures. Visualized paranasal sinuses and mastoid air cells are not opacified.  IMPRESSION: No  acute intracranial abnormalities. Chronic atrophy and small vessel ischemic changes.   Electronically Signed   By: Burman Nieves M.D.   On: 01/09/2014 00:54       Assessment/Plan:   68 y.o. female with   Principal Problem:   1.   Acute encephalopathy-  Most Likely due to UTI   IV Rocephin   Urine sent for C+S        2.   UTI (lower urinary tract infection)   IV Rocephin   Urine Sent for C+S     3.   Weakness   Due to #2   Physical therapy consult     4.   AKI (acute kidney injury)   Hold Lisinopril RX   IVFs   Monitor BUN/Cr     5.   FTT (failure to thrive) in adult- MOst Likely due to Progression of her disease process of Azheimer's Dementia   Nutrition consult   Check Pre-Albumin level     6.   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   Type II diabetes mellitus   SSI coverage PRN   Check HbA1c     8.   Essential hypertension   Hold Lisinopril   PRN IV Hydralazine for SBP > 160     9.   Hypothyroid- per medical Records   Check TSH level     10.   DVT Prophylaxis   Lovenox     Code Status:    FULL CODE   Family Communication:    Husband and Sister at Bedside Disposition Plan:       Inpatient  Time spent:  64 Minutes  Ron Parker [facility] Pager [phone]   If 7AM -7PM Please Contact the Day Rounding Team MD for [facility]  If 7PM-7AM, Please Contact Night-Floor Coverage  [email or web address] Password [email or web address] 01/09/2014, 3:03 AM

## 2014-01-09 NOTE — Progress Notes (Signed)
Physical Therapy Treatment Patient Details Name: [name] [name] MRN: [id number] DOB: [birth year] Today's Date: 01/09/2014    History of Present Illness [name] B Schemm is a 68 y.o. female with a history of Dementia, DM2, HTN, Hyperlipidemia who was brought to the ED due to increased confusion and poor intak e fo foods and liquids over the past 3 days.  Her family is at he bedside and give the history and report that she has had increased weakness and difficulty walking as well over the past 3 days.   She has not had fevers or chills per the family.    She has had a poor appetite for months, and at one time she had been placed on a medication for her appetite (Possibly Megace), but the medication was not refilled because the Insurance would not approve it.   She was evaluated in the ED and was found to have a UTI and was placed on IV Rocephin and referred for admission.    PT Comments    Pt admitted with/for FTT.  Pt currently limited functionally due to the problems listed below.  (see problems list.)  Pt will benefit from PT to maximize function and safety to be able to get home safely with available assist of family.   Follow Up Recommendations  No PT follow up;Other (comment) (They need services to help the family)     Equipment Recommendations  None recommended by PT    Recommendations for Other Services       Precautions / Restrictions Precautions Precautions: Fall Restrictions Weight Bearing Restrictions: No    Mobility  Bed Mobility Overal bed mobility: Needs Assistance Bed Mobility: Supine to Sit;Sit to Supine     Supine to sit: Min assist Sit to supine: Min assist;+2 for physical assistance (mostly because pt didn't want to move.)   General bed mobility comments: pt is able to get OOB with little assist if she wants to do it  Transfers Overall transfer level: Needs assistance   Transfers: Sit to/from Stand Sit to Stand: Min assist             Ambulation/Gait Ambulation/Gait assistance: Min assist;Mod assist Ambulation Distance (Feet): 50 Feet Assistive device: 1 person hand held assist Gait Pattern/deviations: Step-through pattern;Decreased step length - right;Decreased step length - left;Decreased stride length;Shuffle Gait velocity: slow   General Gait Details: short halted steps alternating with smoother steps when pt decided she wanted to move faster.   Stairs            Wheelchair Mobility    Modified Rankin (Stroke Patients Only)       Balance Overall balance assessment: Needs assistance Sitting-balance support: No upper extremity supported Sitting balance-Leahy Scale: Fair     Standing balance support: Single extremity supported Standing balance-Leahy Scale: Poor                      Cognition Arousal/Alertness: Awake/alert Behavior During Therapy: WFL for tasks assessed/performed Overall Cognitive Status: History of cognitive impairments - at baseline       Memory: Decreased short-term memory;Decreased recall of precautions              Exercises      General Comments General comments (skin integrity, edema, etc.): Pt just needs encouragement and flexibility to change tactics when one thing or another doesn't work      Pertinent Vitals/Pain Pain Assessment: No/denies pain    Home Living Family/patient expects to be discharged to:: Private  residence Living Arrangements: Spouse/significant other Available Help at Discharge: Family;Available 24 hours/day Type of Home: House Home Access: Stairs to enter   Home Layout: One level Home Equipment: None      Prior Function Level of Independence: Needs assistance  Gait / Transfers Assistance Needed: is usually ambulatory in a home environment       PT Goals (current goals can now be found in the care plan section) Acute Rehab PT Goals Patient Stated Goal: pt unable to participate in goal setting PT Goal Formulation:  With family Time For Goal Achievement: 01/16/14 Potential to Achieve Goals: Fair    Frequency  Min 2X/week    PT Plan      Co-evaluation             End of Session   Activity Tolerance: Patient tolerated treatment well Patient left: in bed;with call bell/phone within reach;with family/visitor present     Time: [id number] PT Time Calculation (min): 31 min  Charges:  $Gait Training: 8-22 mins $Therapeutic Activity: 8-22 mins                    G Codes:      Cally Nygard, Eliseo GumKenneth V 01/09/2014, 5:07 PM 01/09/2014  [place] BingKen Quantrell Splitt, PT [phone]  (pager)

## 2014-01-09 NOTE — ED Notes (Signed)
IV Team paged to bedside for IV start

## 2014-01-09 NOTE — ED Provider Notes (Signed)
CSN: [id number]     Arrival date & time 01/08/14  1643 History   First MD Initiated Contact with Patient 01/08/14 2340     Chief Complaint  Patient presents with  . Anorexia  . Difficulty Walking     (Consider location/radiation/quality/duration/timing/severity/associated sxs/prior Treatment) HPI [place] B Manganaro is a 68 y.o. female with past medical history of Alzheimer disease, dementia coming in with anorexia and weakness. History was obtained from brother and sister-in-law in the room. They states she's had decreased appetite over the last 3 days it has only been picking at her food. He states he has been altered as well. She has been more violent and at times yells swear words to her husband. He states this is not like her at all. She said nonproductive cough over the past week. She has not been in any pain anywhere over the last couple days. He states that she can normally ambulate without any difficulty or assistance, however now she is so weak she is unable to do so.  Denies any fevers abdominal pain vomiting or change in her stool or urine.  Past Medical History  Diagnosis Date  . 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 Chronic kidney disease   . Alzheimer disease   . DM (diabetes mellitus)   . HLD (hyperlipidemia)   . Memory deficits   . Psychosis   . Major depression   . Hypertension   . Migraine   . GERD (gastroesophageal reflux disease)   . Eczema   . Hypothyroidism   . Rhinitis   . Arthritis   . Dementia of the Alzheimer's type, with late onset, with depressive mood 05/01/2013  . 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 Abdominal hysterectomy    . Stomach surgery      ?  Marland Kitchen Thyroid surgery    . Dental surgery     Family History  Problem Relation Age of Onset  . Diabetes Mother   . Diabetes Brother   . Diabetes Brother   . Diabetes Sister   . Diabetes Sister   . Diabetes  Sister   . Diabetes Sister   . Diabetes Sister   . Diabetes Sister    History  Substance Use Topics  . Smoking status: Never Smoker   . Smokeless tobacco: Never Used  . Alcohol Use: No   OB History   Grav Para Term Preterm Abortions TAB SAB Ect Mult Living                 Review of Systems  Unable to perform ROS: Dementia      Allergies  Benazepril-hydrochlorothiazide  Home Medications   Prior to Admission medications   Medication Sig Start Date End Date Taking? Authorizing Provider  aspirin 81 MG tablet Take 81 mg by mouth daily.     Yes Historical Provider, MD  atorvastatin (LIPITOR) 20 MG tablet Take 20 mg by mouth daily.     Yes Historical Provider, MD  clonazePAM (KLONOPIN) 0.5 MG tablet Take 0.5 mg by mouth daily as needed for anxiety.   Yes Historical Provider, MD  ergocalciferol (VITAMIN D2) 50000 UNITS capsule Take 50,000 Units by mouth once a week. No specific day   Yes Historical Provider, MD  levocetirizine (XYZAL) 5 MG tablet Take 5 mg by mouth daily.  04/18/13  Yes Historical Provider, MD  lisinopril (PRINIVIL,ZESTRIL) 10 MG tablet Take 10 mg by  mouth daily.  04/05/13  Yes Historical Provider, MD  Memantine HCl ER (NAMENDA XR) 28 MG CP24 Take 28 mg by mouth daily.   Yes Historical Provider, MD  omega-3 acid ethyl esters (LOVAZA) 1 G capsule Take 1 g by mouth daily.   Yes Historical Provider, MD  venlafaxine (EFFEXOR) 75 MG tablet Take 75 mg by mouth 2 (two) times daily.   Yes Historical Provider, MD   BP 138/57  Pulse 54  Temp(Src) 98.8 F (37.1 C)  Resp 18  SpO2 100% Physical Exam  Nursing note and vitals reviewed. Constitutional: She appears well-developed and well-nourished. No distress.  HENT:  Head: Normocephalic and atraumatic.  Nose: Nose normal.  Mouth/Throat: No oropharyngeal exudate.  Dry mucous membranes  Eyes: Conjunctivae and EOM are normal. Pupils are equal, round, and reactive to light. No scleral icterus.  Neck: Normal range of motion.  Neck supple. No JVD present. No tracheal deviation present. No thyromegaly present.  Cardiovascular: Normal rate and regular rhythm.  Exam reveals no gallop and no friction rub.   Murmur heard. Pulmonary/Chest: Effort normal and breath sounds normal. No respiratory distress. She has no wheezes. She exhibits no tenderness.  Abdominal: Soft. Bowel sounds are normal. She exhibits no distension and no mass. There is no tenderness. There is no rebound and no guarding.  Musculoskeletal: Normal range of motion. She exhibits no edema and no tenderness.  Lymphadenopathy:    She has no cervical adenopathy.  Neurological: She is alert. No cranial nerve deficit. She exhibits normal muscle tone.  Skin: Skin is warm and dry. No rash noted. She is not diaphoretic. No erythema. No pallor.    ED Course  Procedures (including critical care time) Labs Review Labs Reviewed  COMPREHENSIVE METABOLIC PANEL - Abnormal; Notable for the following:    Glucose, Bld 105 (*)    BUN 32 (*)    Creatinine, Ser 1.68 (*)    Total Protein 8.7 (*)    GFR calc non Af Amer 31 (*)    GFR calc Af Amer 36 (*)    All other components within normal limits  CBC WITH DIFFERENTIAL - Abnormal; Notable for the following:    RBC 3.67 (*)    Hemoglobin 11.0 (*)    HCT 33.6 (*)    All other components within normal limits  URINALYSIS, ROUTINE W REFLEX MICROSCOPIC - Abnormal; Notable for the following:    APPearance CLOUDY (*)    Ketones, ur 15 (*)    Leukocytes, UA LARGE (*)    All other components within normal limits  URINE MICROSCOPIC-ADD ON - Abnormal; Notable for the following:    Squamous Epithelial / LPF MANY (*)    Bacteria, UA MANY (*)    All other components within normal limits  CK    Imaging Review Dg Chest 2 View  01/09/2014   CLINICAL DATA:  Anorexia, difficulty walking  EXAM: CHEST  2 VIEW  COMPARISON:  10/10/2013  FINDINGS: Lungs are essentially clear. No focal consolidation. No pleural effusion or  pneumothorax.  The heart is top-normal in size.  Visualized osseous structures are within normal limits.  IMPRESSION: No evidence of acute cardiopulmonary disease.   Electronically Signed   By: Charline Bills M.D.   On: 01/09/2014 00:40   Ct Head Wo Contrast  01/09/2014   CLINICAL DATA:  Anorexia.  Difficulty walking.  EXAM: CT HEAD WITHOUT CONTRAST  TECHNIQUE: Contiguous axial images were obtained from the base of the skull through the vertex without  intravenous contrast.  COMPARISON:  MRI brain 05/16/2013  FINDINGS: Diffuse cerebral atrophy. Ventricular dilatation likely due to central atrophy. Low-attenuation changes in the deep white matter consistent with small vessel ischemia. No mass effect or midline shift. No abnormal extra-axial fluid collections. Gray-white matter junctions are distinct. Basal cisterns are not effaced. No evidence of acute intracranial hemorrhage. No depressed skull fractures. Visualized paranasal sinuses and mastoid air cells are not opacified.  IMPRESSION: No acute intracranial abnormalities. Chronic atrophy and small vessel ischemic changes.   Electronically Signed   By: Burman NievesWilliam  Stevens M.D.   On: 01/09/2014 00:54     EKG Interpretation None      MDM   Final diagnoses:  None    Patient presents emergency department out of concern for altered mental status, anorexia, and weakness. Will obtain laboratory studies, imaging including CT head and chest x-ray.  Urinalysis reveals a urinary tract infection. She was given a liter of fluids and ceftriaxone for treatment. Because the patient is altered and no longer able to walk she'll be retained in the hospital under the hospitalist service for continued care.    Tomasita CrumbleAdeleke Sharmain Lastra, MD 01/09/14 [phone]

## 2014-01-09 NOTE — ED Notes (Signed)
RN attempt IV start; 2nd RN to attempt IV start

## 2014-01-09 NOTE — Progress Notes (Addendum)
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 UTI (lower urinary tract infection)  IV Rocephin  Urine culture pending  3. Weakness  PT/OT eval  4. chronic kidney disease, stage III Hold Lisinopril RX  Baseline creatinine is 2-2.3 IVFs  Monitor BUN/Cr    5. FTT (failure to thrive) in adult- MOst Likely due to Progression of her disease process of Azheimer's Dementia  Nutrition consult  Check Pre-Albumin level    6. 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 Type II diabetes mellitus  SSI coverage PRN  Pending HbA1c   8. Essential hypertension -controlled Hold Lisinopril  PRN IV Hydralazine for SBP > 160   9. Hypothyroid- per medical Records  Check TSH level   10. DVT Prophylaxis  Lovenox   Code Status: FULL CODE   Disposition Plan:  As above    Brief narrative: [name] [name] is a 68 y.o. female with a history of Dementia, DM2, HTN, Hyperlipidemia who was brought to the ED due to increased confusion and poor intak e fo foods and liquids over the past 3 days. Her family is at he bedside and give the history and report that she has had increased weakness and difficulty walking as well over the past 3 days. She has not had fevers or chills per the family. She has had a poor appetite for months, and at one time she had been placed on a medication for her appetite (Possibly Megace),  but the medication was not refilled because the Insurance would not approve it. She was evaluated in the ED and was found to have a UTI and was placed on IV Rocephin and referred for admission.    Consultants:  None  Procedures:  None  Antibiotics:  Assessment  HPI/Subjective: Somnolent but when aroused patient can answer appropriately  Objective: Filed Vitals:   01/09/14 0300 01/09/14 0352 01/09/14 1000 01/09/14 1140  BP: 152/62 161/72 135/52   Pulse: 56 53 30   Temp:  97.7 F (36.5 C) 99.2 F (37.3 C)   TempSrc:  Oral Oral   Resp: 16 16 18    Height:    5\' 5"  (1.651 m)  Weight:  73.664 kg (162 lb 6.4 oz)  74.435 kg (164 lb 1.6 oz)  SpO2: 99% 100% 100%    No intake or output data in the 24 hours ending 01/09/14 1209  Exam:  General: alert & oriented to self, In NAD  Cardiovascular: RRR, nl S1 s2  Respiratory: Decreased breath sounds at the bases, scattered rhonchi, no crackles  Abdomen: soft +BS NT/ND, no masses palpable  Extremities: No cyanosis and no edema      Data Reviewed: Basic Metabolic Panel:  Recent Labs Lab 01/08/14 2242 01/09/14 0602  NA 138 142  K 5.2 5.3  CL 99 106  CO2 25 26  GLUCOSE 105* 116*  BUN 32* 28*  CREATININE 1.68* 1.52*  CALCIUM 9.4  9.0    Liver Function Tests:  Recent Labs Lab 01/08/14 2242  AST 19  ALT 12  ALKPHOS 86  BILITOT 0.5  PROT 8.7*  ALBUMIN 4.1   No results found for this basename: LIPASE, AMYLASE,  in the last 168 hours No results found for this basename: AMMONIA,  in the last 168 hours  CBC:  Recent Labs Lab 01/08/14 2242 01/09/14 0602  WBC 7.2 6.9  NEUTROABS 4.2  --   HGB 11.0* 10.6*  HCT 33.6* 32.9*  MCV 91.6 93.5  PLT 219 193    Cardiac Enzymes:  Recent Labs Lab 01/08/14 2242  CKTOTAL 106   BNP (last 3 results) No results found for this basename: PROBNP,  in the last 8760 hours   CBG:  Recent Labs Lab 01/09/14 0400 01/09/14 0957 01/09/14 1202  GLUCAP 110* 124* 137*     No results found for this or any previous visit (from the past 240 hour(s)).   Studies: Dg Chest 2 View  01/09/2014   CLINICAL DATA:  Anorexia, difficulty walking  EXAM: CHEST  2 VIEW  COMPARISON:  10/10/2013  FINDINGS: Lungs are essentially clear. No focal consolidation. No pleural effusion or pneumothorax.  The heart is top-normal in size.  Visualized osseous structures are within normal limits.  IMPRESSION: No evidence of acute cardiopulmonary disease.   Electronically Signed   By: Charline Bills M.D.   On: 01/09/2014 00:40   Ct Head Wo Contrast  01/09/2014   CLINICAL DATA:  Anorexia.  Difficulty walking.  EXAM: CT HEAD WITHOUT CONTRAST  TECHNIQUE: Contiguous axial images were obtained from the base of the skull through the vertex without intravenous contrast.  COMPARISON:  MRI brain 05/16/2013  FINDINGS: Diffuse cerebral atrophy. Ventricular dilatation likely due to central atrophy. Low-attenuation changes in the deep white matter consistent with small vessel ischemia. No mass effect or midline shift. No abnormal extra-axial fluid collections. Gray-white matter junctions are distinct. Basal cisterns are not effaced. No evidence of acute intracranial hemorrhage. No depressed skull fractures. Visualized paranasal sinuses and mastoid air cells are not opacified.  IMPRESSION: No acute intracranial abnormalities. Chronic atrophy and small vessel ischemic changes.   Electronically Signed   By: Burman Nieves M.D.   On: 01/09/2014 00:54    Scheduled Meds: . aspirin  81 mg Oral Daily  . atorvastatin  20 mg Oral Daily  . [START ON 01/10/2014] cefTRIAXone (ROCEPHIN)  IV  1 g Intravenous Q24H  . enoxaparin (LOVENOX) injection  30 mg Subcutaneous Q24H  . insulin aspart  0-9 Units Subcutaneous 6 times per day  . loratadine  10 mg Oral Daily  . Memantine HCl ER  28 mg Oral Daily  . omega-3 acid ethyl esters  1 g Oral Daily  . venlafaxine  75 mg Oral BID   Continuous Infusions: . 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 If 7PM-7AM, please contact night-coverage at [email or web address], password [facility] 01/09/2014, 12:09 PM  LOS: 1 day

## 2014-01-09 NOTE — ED Notes (Signed)
Patient transported to X-ray 

## 2014-01-10 DIAGNOSIS — E440 Moderate protein-calorie malnutrition: Secondary | ICD-10-CM | POA: Insufficient documentation

## 2014-01-10 LAB — VITAMIN B12: Vitamin B-12: 1022 pg/mL — ABNORMAL HIGH (ref 211–911)

## 2014-01-10 LAB — GLUCOSE, CAPILLARY
GLUCOSE-CAPILLARY: 111 mg/dL — AB (ref 70–99)
Glucose-Capillary: 108 mg/dL — ABNORMAL HIGH (ref 70–99)
Glucose-Capillary: 134 mg/dL — ABNORMAL HIGH (ref 70–99)
Glucose-Capillary: 116 mg/dL — ABNORMAL HIGH (ref 70–99)
Glucose-Capillary: 124 mg/dL — ABNORMAL HIGH (ref 70–99)

## 2014-01-10 LAB — TSH: TSH: 2.31 u[IU]/mL (ref 0.350–4.500)

## 2014-01-10 NOTE — Progress Notes (Signed)
Speech Language Pathology Treatment: Dysphagia  Patient Details Name: [name] [name] MRN: [id number] DOB: 12-24-1945 Today's Date: 01/10/2014 Time: 1003-4961 SLP Time Calculation (min): 12 min  Assessment / Plan / Recommendation Clinical Impression  Pt demonstrates adequate tolerance of thin liquids and sweet purees with max verbal and tactile cues (pts vision is impaired). She is likely to refuse many foods based on taste, but has no impairment to keep her from consuming purees and puddings in addition to thin liquids. Will upgrade to Dys 1 (puree)/thin and sign off.    HPI HPI: 68 year old female admitted 01/08/14 due to decreased po intake with subsequent increase in confusion and weakness. PMH signfiicant for dementia, DM, HTN, GERD. No prior ST intervention found   Pertinent Vitals Pain Assessment: No/denies pain  SLP Plan  All goals met    Recommendations Diet recommendations: Dysphagia 1 (puree);Thin liquid Liquids provided via: Cup;Teaspoon Medication Administration: Crushed with puree Supervision: Staff to assist with self feeding Compensations: Slow rate;Small sips/bites Postural Changes and/or Swallow Maneuvers: Seated upright 90 degrees;Upright 30-60 min after meal              Plan: All goals met    GO    [name] Baltimore, MA CCC-SLP [phone]  [name] [name] 01/10/2014, 11:42 AM

## 2014-01-10 NOTE — Evaluation (Signed)
Occupational Therapy Evaluation and Discharge Patient Details Name: [name] [name] MRN: [id number] DOB: 02-Aug-1945 Today's Date: 01/10/2014    History of Present Illness [name] [name] is a 68 y.o. female with a history of Dementia, DM2, HTN, Hyperlipidemia who was brought to the ED due to increased confusion and poor intake of foods and liquids over the past 3 days.  Her family is at he bedside and give the history and report that she has had increased weakness and difficulty walking as well over the past 3 days.She was evaluated in the ED and was found to have a UTI and was placed on IV Rocephin and referred for admission.   Clinical Impression   This 68 yo female admitted with above presents to acute OT at a variable A level due to her dementia and questionable vision issues (v. Perceptual issues). Per family in room someone is with her 24/7. No further skilled OT needs identified, we will sign off.    Follow Up Recommendations  No OT follow up    Equipment Recommendations  None recommended by OT       Precautions / Restrictions Precautions Precautions: Fall Restrictions Weight Bearing Restrictions: No      Mobility Bed Mobility               General bed mobility comments: Pt up in recliner upon arrival  Transfers Overall transfer level: Needs assistance Equipment used: 1 person hand held assist Transfers: Sit to/from Stand Sit to Stand: Min assist                   ADL Overall ADL's : Needs assistance/impaired                                       General ADL Comments: Pt can do all tasks if given enough time, direction, and variable hands on A. Noted issues with pt's vision (ie: "shake my hand" and she responded "Ok, where" and I am moving my hand all around in front of her and she is not regarding it at all, then on another occasion I asked her to do it again and she immediately reached forward and grabbed my hand. I placed a spoon in  her right hand--but she did not immediately regard that something was in her hand, once she did she held the spoon and was able to self feed herself some applesauce with me holding the applesauce container; when attempting to get her to hold the container in the left hand--again she did not immediately regard that I was placing something in her hand, once she did she did self feed from the container with close S not to drop the container.      Vision                 Additional Comments: Pt did blink to confrontational testing however other visual issues noted (see ADL section). Pt inconsistent with visual regard. With ambulation she did manuever around the end of the bed without cuing to not run into it.          Pertinent Vitals/Pain Pain Assessment: No/denies pain     Hand Dominance Right   Extremity/Trunk Assessment Upper Extremity Assessment Upper Extremity Assessment: Overall WFL for tasks assessed           Communication Communication Communication: No difficulties   Cognition Arousal/Alertness: Awake/alert Behavior During Therapy:  WFL for tasks assessed/performed Overall Cognitive Status: Impaired/Different from baseline Area of Impairment: Following commands       Following Commands: Follows one step commands inconsistently;Follows one step commands with increased time (increased cuing)                      Home Living Family/patient expects to be discharged to:: Private residence Living Arrangements: Spouse/significant other;Other relatives Available Help at Discharge: Family;Available 24 hours/day (husband is on dialysis) Type of Home: House Home Access: Stairs to enter     Home Layout: One level     Bathroom Shower/Tub: Tub/shower unit (family has been A'ing pt with sponge baths due to increased risk of falls getting in and out of tub)   Bathroom Toilet: Standard     Home Equipment: None          Prior Functioning/Environment Level of  Independence: Needs assistance  Gait / Transfers Assistance Needed: is usually ambulatory in a home environment--but has needed more A in the last several days ADL's / Homemaking Assistance Needed: Usually manages her self care--however has needed more A in the last several days; wears Depends        OT Diagnosis: Generalized weakness;Cognitive deficits;Disturbance of vision                          End of Session    Activity Tolerance: Patient tolerated treatment well Patient left: in chair;with chair alarm set;with family/visitor present   Time: 0981-1914 OT Time Calculation (min): 31 min Charges:  OT General Charges $OT Visit: 1 Procedure OT Treatments $Self Care/Home Management : 23-37 mins     Evette Georges [phone] 01/10/2014, 5:11 PM

## 2014-01-10 NOTE — Progress Notes (Signed)
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 UTI (lower urinary tract infection)  IV Rocephin  Urine culture pending   3. Weakness  PT/OT eval and no PT needs at this time  4. chronic kidney disease, stage III  Hold Lisinopril RX  Baseline creatinine is 2-2.3  Continue IV fluids Monitor BUN/Cr   5. FTT (failure to thrive) in adult- MOst Likely due to Progression of her disease process of Azheimer's Dementia  Nutrition consult  Check Pre-Albumin level  Currently on dysphagia 1 diet with thin liquids  6. 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 Type II diabetes mellitus  SSI coverage PRN  Hemoglobin A1c 6.2  8. Essential hypertension -controlled  Hold Lisinopril  PRN IV Hydralazine for SBP > 160    9. Hypothyroid- per medical Records  Check TSH level   10. DVT Prophylaxis  Lovenox    Code Status: FULL CODE  Disposition Plan: Anticipate home tomorrow  Brief narrative:  [name] [name] is a 68 y.o. female with a history of Dementia, DM2, HTN, Hyperlipidemia who was brought to the ED due to increased confusion and poor intak e fo foods and liquids over the past 3 days. Her family is at he bedside and give the history and report that she has had increased weakness and difficulty walking as well over the past 3 days. She has not had fevers or  chills per the family. She has had a poor appetite for months, and at one time she had been placed on a medication for her appetite (Possibly Megace), but the medication was not refilled because the Insurance would not approve it. She was evaluated in the ED and was found to have a UTI and was placed on IV Rocephin and referred for admission.  Consultants:  None Procedures:  None Antibiotics:  Assessment   HPI/Subjective: Patient is much more alert and awake answeres questions appropriately no complaints  Objective: Filed Vitals:   01/09/14 2015 01/09/14 2357 01/10/14 0419 01/10/14 1033  BP: 151/59 125/50 136/50 129/58  Pulse: 67 60 59 58  Temp: 99.2 F (37.3 C) 99 F (37.2 C) 98.1 F (36.7 C) 99.2 F (37.3 C)  TempSrc: Oral Oral Oral Oral  Resp: 18 18 18 20   Height:      Weight:      SpO2: 100% 100% 100% 100%    Intake/Output Summary (Last 24 hours) at 01/10/14 1159 Last data filed at 01/10/14 1036  Gross per 24 hour  Intake    508 ml  Output      0 ml  Net    508 ml    Exam:  General: To self and place, In NAD  Cardiovascular: RRR, nl S1 s2  Respiratory: Decreased breath sounds at the bases, scattered rhonchi, no crackles  Abdomen: soft +BS NT/ND, no masses palpable  Extremities:  No cyanosis and no edema      Data Reviewed: Basic Metabolic Panel:  Recent Labs Lab 01/08/14 2242 01/09/14 0602  NA 138 142  K 5.2 5.3  CL 99 106  CO2 25 26  GLUCOSE 105* 116*  BUN 32* 28*  CREATININE 1.68* 1.52*  CALCIUM 9.4 9.0    Liver Function Tests:  Recent Labs Lab 01/08/14 2242  AST 19  ALT 12  ALKPHOS 86  BILITOT 0.5  PROT 8.7*  ALBUMIN 4.1   No results found for this basename: LIPASE, AMYLASE,  in the last 168 hours No results found for this basename: AMMONIA,  in the last 168 hours  CBC:  Recent Labs Lab 01/08/14 2242 01/09/14 0602  WBC 7.2 6.9  NEUTROABS 4.2  --   HGB 11.0* 10.6*  HCT 33.6* 32.9*  MCV 91.6 93.5  PLT 219 193     Cardiac Enzymes:  Recent Labs Lab 01/08/14 2242  CKTOTAL 106   BNP (last 3 results) No results found for this basename: PROBNP,  in the last 8760 hours   CBG:  Recent Labs Lab 01/09/14 2018 01/09/14 2354 01/10/14 0418 01/10/14 0728 01/10/14 1130  GLUCAP 127* 90 108* 111* 134*    No results found for this or any previous visit (from the past 240 hour(s)).   Studies: Dg Chest 2 View  01/09/2014   CLINICAL DATA:  Anorexia, difficulty walking  EXAM: CHEST  2 VIEW  COMPARISON:  10/10/2013  FINDINGS: Lungs are essentially clear. No focal consolidation. No pleural effusion or pneumothorax.  The heart is top-normal in size.  Visualized osseous structures are within normal limits.  IMPRESSION: No evidence of acute cardiopulmonary disease.   Electronically Signed   By: Charline Bills M.D.   On: 01/09/2014 00:40   Ct Head Wo Contrast  01/09/2014   CLINICAL DATA:  Anorexia.  Difficulty walking.  EXAM: CT HEAD WITHOUT CONTRAST  TECHNIQUE: Contiguous axial images were obtained from the base of the skull through the vertex without intravenous contrast.  COMPARISON:  MRI brain 05/16/2013  FINDINGS: Diffuse cerebral atrophy. Ventricular dilatation likely due to central atrophy. Low-attenuation changes in the deep white matter consistent with small vessel ischemia. No mass effect or midline shift. No abnormal extra-axial fluid collections. Gray-white matter junctions are distinct. Basal cisterns are not effaced. No evidence of acute intracranial hemorrhage. No depressed skull fractures. Visualized paranasal sinuses and mastoid air cells are not opacified.  IMPRESSION: No acute intracranial abnormalities. Chronic atrophy and small vessel ischemic changes.   Electronically Signed   By: Burman Nieves M.D.   On: 01/09/2014 00:54    Scheduled Meds: . aspirin  81 mg Oral Daily  . atorvastatin  20 mg Oral Daily  . cefTRIAXone (ROCEPHIN)  IV  1 g Intravenous Q24H  . enoxaparin (LOVENOX)  injection  30 mg Subcutaneous Q24H  . insulin aspart  0-9 Units Subcutaneous 6 times per day  . loratadine  10 mg Oral Daily  . Memantine HCl ER  28 mg Oral Daily  . omega-3 acid ethyl esters  1 g Oral Daily  . venlafaxine  75 mg Oral BID   Continuous Infusions: . 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 If 7PM-7AM, please contact night-coverage at [email or web address], password [email or web address] 01/10/2014, 11:59 AM  LOS: 2 days

## 2014-01-11 DIAGNOSIS — N390 Urinary tract infection, site not specified: Principal | ICD-10-CM

## 2014-01-11 LAB — COMPREHENSIVE METABOLIC PANEL
ALT: 11 U/L (ref 0–35)
ANION GAP: 12 (ref 5–15)
AST: 16 U/L (ref 0–37)
Albumin: 3.3 g/dL — ABNORMAL LOW (ref 3.5–5.2)
Alkaline Phosphatase: 85 U/L (ref 39–117)
BILIRUBIN TOTAL: 0.3 mg/dL (ref 0.3–1.2)
BUN: 18 mg/dL (ref 6–23)
CHLORIDE: 103 meq/L (ref 96–112)
CO2: 23 meq/L (ref 19–32)
Calcium: 9 mg/dL (ref 8.4–10.5)
Creatinine, Ser: 1.26 mg/dL — ABNORMAL HIGH (ref 0.50–1.10)
GFR calc Af Amer: 50 mL/min — ABNORMAL LOW (ref >90)
GFR, EST NON AFRICAN AMERICAN: 43 mL/min — AB (ref >90)
Glucose, Bld: 96 mg/dL (ref 70–99)
POTASSIUM: 5.5 meq/L — AB (ref 3.7–5.3)
Sodium: 138 mEq/L (ref 137–147)
Total Protein: 7.1 g/dL (ref 6.0–8.3)

## 2014-01-11 LAB — GLUCOSE, CAPILLARY
GLUCOSE-CAPILLARY: 115 mg/dL — AB (ref 70–99)
Glucose-Capillary: 119 mg/dL — ABNORMAL HIGH (ref 70–99)
Glucose-Capillary: 91 mg/dL (ref 70–99)

## 2014-01-11 LAB — CBC
HEMATOCRIT: 31.8 % — AB (ref 36.0–46.0)
Hemoglobin: 10.4 g/dL — ABNORMAL LOW (ref 12.0–15.0)
MCH: 29.8 pg (ref 26.0–34.0)
MCHC: 32.7 g/dL (ref 30.0–36.0)
MCV: 91.1 fL (ref 78.0–100.0)
PLATELETS: 197 10*3/uL (ref 150–400)
RBC: 3.49 MIL/uL — ABNORMAL LOW (ref 3.87–5.11)
RDW: 13.6 % (ref 11.5–15.5)
WBC: 10 10*3/uL (ref 4.0–10.5)

## 2014-01-11 MED ORDER — CIPROFLOXACIN HCL 500 MG PO TABS: 500.0000 mg | ORAL_TABLET | Freq: Two times a day (BID) | ORAL | Status: AC

## 2014-01-11 MED ORDER — SODIUM POLYSTYRENE SULFONATE 15 GM/60ML PO SUSP
15.0000 g | Freq: Once | ORAL | Status: AC
  Administered 2014-01-11: 15 g via ORAL
  Filled 2014-01-11: qty 60

## 2014-01-11 NOTE — Discharge Summary (Signed)
Physician Discharge Summary  [place] B Pizzini MRN: [id number] DOB/AGE: February 07, 1946 68 y.o.  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     atorvastatin 20 MG tablet  Commonly known as:  LIPITOR  Take 20 mg by mouth daily.     ciprofloxacin 500 MG tablet  Commonly known as:  CIPRO  Take 1 tablet (500 mg total) by mouth 2 (two) times daily.     clonazePAM 0.5 MG tablet  Commonly known as:  KLONOPIN  Take 0.5 mg by mouth daily as needed for anxiety.     ergocalciferol 50000 UNITS capsule  Commonly known as:  VITAMIN D2  Take 50,000 Units by mouth once a week. No specific day     levocetirizine 5 MG tablet  Commonly known as:  XYZAL  Take 5 mg by mouth daily.     NAMENDA XR 28 MG Cp24  Generic drug:  Memantine HCl ER  Take 28 mg by mouth daily.     omega-3 acid ethyl esters 1 G capsule  Commonly known as:  LOVAZA  Take 1 g by mouth daily.     venlafaxine 75 MG tablet  Commonly known as:  EFFEXOR  Take 75 mg by mouth 2 (two) times daily.        Discharge Condition: Stable Disposition: 01-Home or Self Care   Consults: None  Significant Diagnostic Studies: Dg Chest 2 View  01/09/2014   CLINICAL DATA:  Anorexia, difficulty walking  EXAM: CHEST  2 VIEW  COMPARISON:  10/10/2013  FINDINGS: Lungs are  essentially clear. No focal consolidation. No pleural effusion or pneumothorax.  The heart is top-normal in size.  Visualized osseous structures are within normal limits.  IMPRESSION: No evidence of acute cardiopulmonary disease.   Electronically Signed   By: Julian Hy M.D.   On: 01/09/2014 00:40   Ct Head Wo Contrast  01/09/2014   CLINICAL DATA:  Anorexia.  Difficulty walking.  EXAM: CT HEAD WITHOUT CONTRAST  TECHNIQUE: Contiguous axial images were obtained from the base of the skull through the vertex without intravenous contrast.  COMPARISON:  MRI brain 05/16/2013  FINDINGS: Diffuse cerebral atrophy. Ventricular dilatation likely due to central atrophy. Low-attenuation changes in the deep white matter consistent with small vessel ischemia. No mass effect or midline shift. No abnormal extra-axial fluid collections. Gray-white matter junctions are distinct. Basal cisterns are not effaced. No evidence of acute intracranial hemorrhage. No depressed skull fractures. Visualized paranasal sinuses and mastoid air cells are not opacified.  IMPRESSION: No acute intracranial abnormalities. Chronic atrophy and small vessel ischemic changes.   Electronically Signed   By: Lucienne Capers M.D.   On: 01/09/2014 00:54       Microbiology: No results found for this or any previous  visit (from the past 240 hour(s)).   Labs: Results for orders placed during the hospital encounter of 01/08/14 (from the past 48 hour(s))  GLUCOSE, CAPILLARY     Status: Abnormal   Collection Time    01/09/14 12:02 PM      Result Value Ref Range   Glucose-Capillary 137 (*) 70 - 99 mg/dL  GLUCOSE, CAPILLARY     Status: Abnormal   Collection Time    01/09/14  4:16 PM      Result Value Ref Range   Glucose-Capillary 124 (*) 70 - 99 mg/dL  GLUCOSE, CAPILLARY     Status: Abnormal   Collection Time    01/09/14  8:18 PM      Result Value Ref Range   Glucose-Capillary 127 (*) 70 - 99 mg/dL  GLUCOSE, CAPILLARY     Status: None    Collection Time    01/09/14 11:54 PM      Result Value Ref Range   Glucose-Capillary 90  70 - 99 mg/dL  GLUCOSE, CAPILLARY     Status: Abnormal   Collection Time    01/10/14  4:18 AM      Result Value Ref Range   Glucose-Capillary 108 (*) 70 - 99 mg/dL  GLUCOSE, CAPILLARY     Status: Abnormal   Collection Time    01/10/14  7:28 AM      Result Value Ref Range   Glucose-Capillary 111 (*) 70 - 99 mg/dL  TSH     Status: None   Collection Time    01/10/14 11:05 AM      Result Value Ref Range   TSH 2.310  0.350 - 4.500 uIU/mL  VITAMIN B12     Status: Abnormal   Collection Time    01/10/14 11:05 AM      Result Value Ref Range   Vitamin B-12 1022 (*) 211 - 911 pg/mL   Comment: Performed at [facility], CAPILLARY     Status: Abnormal   Collection Time    01/10/14 11:30 AM      Result Value Ref Range   Glucose-Capillary 134 (*) 70 - 99 mg/dL   Comment 1 Notify RN     Comment 2 Documented in Chart    GLUCOSE, CAPILLARY     Status: Abnormal   Collection Time    01/10/14  5:00 PM      Result Value Ref Range   Glucose-Capillary 116 (*) 70 - 99 mg/dL   Comment 1 Notify RN     Comment 2 Documented in Chart    GLUCOSE, CAPILLARY     Status: Abnormal   Collection Time    01/10/14  8:08 PM      Result Value Ref Range   Glucose-Capillary 124 (*) 70 - 99 mg/dL   Comment 1 Documented in Chart     Comment 2 Notify RN    GLUCOSE, CAPILLARY     Status: None   Collection Time    01/11/14 12:07 AM      Result Value Ref Range   Glucose-Capillary 91  70 - 99 mg/dL   Comment 1 Documented in Chart     Comment 2 Notify RN    COMPREHENSIVE METABOLIC PANEL     Status: Abnormal   Collection Time    01/11/14  4:26 AM      Result Value Ref Range   Sodium 138  137 - 147 mEq/L   Potassium 5.5 (*) 3.7 - 5.3 mEq/L  Chloride 103  96 - 112 mEq/L   CO2 23  19 - 32 mEq/L   Glucose, Bld 96  70 - 99 mg/dL   BUN 18  6 - 23 mg/dL   Creatinine, Ser 1.26 (*) 0.50 - 1.10 mg/dL    Calcium 9.0  8.4 - 10.5 mg/dL   Total Protein 7.1  6.0 - 8.3 g/dL   Albumin 3.3 (*) 3.5 - 5.2 g/dL   AST 16  0 - 37 U/L   ALT 11  0 - 35 U/L   Alkaline Phosphatase 85  39 - 117 U/L   Total Bilirubin 0.3  0.3 - 1.2 mg/dL   GFR calc non Af Amer 43 (*) >90 mL/min   GFR calc Af Amer 50 (*) >90 mL/min   Comment: (NOTE)     The eGFR has been calculated using the CKD EPI equation.     This calculation has not been validated in all clinical situations.     eGFR's persistently <90 mL/min signify possible Chronic Kidney     Disease.   Anion gap 12  5 - 15  CBC     Status: Abnormal   Collection Time    01/11/14  4:26 AM      Result Value Ref Range   WBC 10.0  4.0 - 10.5 K/uL   RBC 3.49 (*) 3.87 - 5.11 MIL/uL   Hemoglobin 10.4 (*) 12.0 - 15.0 g/dL   HCT 31.8 (*) 36.0 - 46.0 %   MCV 91.1  78.0 - 100.0 fL   MCH 29.8  26.0 - 34.0 pg   MCHC 32.7  30.0 - 36.0 g/dL   RDW 13.6  11.5 - 15.5 %   Platelets 197  150 - 400 K/uL  GLUCOSE, CAPILLARY     Status: Abnormal   Collection Time    01/11/14  7:34 AM      Result Value Ref Range   Glucose-Capillary 119 (*) 70 - 99 mg/dL   Comment 1 Notify RN     Comment 2 Documented in Chart       HPI  68 y.o. female with a history of Dementia, DM2, HTN, Hyperlipidemia who was brought to the ED due to increased confusion and poor intak e fo foods and liquids over the past 3 days. Her family is at he bedside and give the history and report that she has had increased weakness and difficulty walking as well over the past 3 days. She has not had fevers or chills per the family. She has had a poor appetite for months, and at one time she had been placed on a medication for her appetite (Possibly Megace), but the medication was not refilled because the Insurance would not approve it. She was evaluated in the ED and was found to have a UTI and was placed on IV Rocephin and referred for admission.   HOSPITAL COURSE: * Acute encephalopathy in the setting of underlying  dementia and secondary to UTI  CT of the head is negative , improving  Normal TSH and vitamin B12  2. UTI (lower urinary tract infection)  Initiated on IV Rocephin x3 days switched to ciprofloxacin for 5 days then discontinue Urine culture pending   3. Weakness  PT/OT eval and no PT needs at this time   4. chronic kidney disease, stage 2 Discontinued lisinopril Creatinine improved significantly after discontinuation of lisinopril and IV hydration Patient also had hyperkalemia requiring Kayexalate   5. FTT (failure to thrive) in adult-  MOst Likely due to Progression of her disease process of Azheimer's Dementia  Nutrition consult continue Ensure Currently on dysphagia 1 diet with thin liquids per speech therapy recommendations  6. 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 Type II diabetes mellitus  SSI coverage PRN  Hemoglobin A1c 6.2    8. Essential hypertension -controlled  Discontinued lisinopril    9. Hypothyroid- per medical Records  TSH within normal limits       Discharge Exam:  Blood pressure 124/59, pulse 58, temperature 98.9 F (37.2 C), temperature source Oral, resp. rate 20, height 5' 5"  (1.651 m), weight 74.435 kg (164 lb 1.6 oz), SpO2 100.00%.   General: To self and place, In NAD  Cardiovascular: RRR, nl S1 s2  Respiratory: Decreased breath sounds at the bases, scattered rhonchi, no crackles  Abdomen: soft +BS NT/ND, no masses palpable  Extremities: No cyanosis and no edema       Discharge Instructions   Diet - low sodium heart healthy    Complete by:  As directed      Increase activity slowly    Complete by:  As directed            Follow-up Information   Follow up with AVBUERE,EDWIN A, MD. Schedule an appointment as soon as possible for a visit in 1 week. (Recheck CBC, CMP)    Specialty:  Internal Medicine   Contact information:   [address] [place] [address] [phone]       Signed: Reyne Dumas 01/11/2014, 10:58 AM

## 2014-01-11 NOTE — Progress Notes (Signed)
1300 - discharge instructions given to pt and pt's husband. Pt's husband verbally acknowledged understanding, voiced no questions when prompted. Iv catheter removed by nurse without difficulty, intact. Pt tolerated well. Pt to be transported out of unit per wheelchair by tech and home by private car by husband.  Will monitor.  Andrew AuVafiadis, Chrystine Frogge I 01/11/2014 1:22 PM

## 2014-02-02 ENCOUNTER — Other Ambulatory Visit (HOSPITAL_COMMUNITY): Payer: Self-pay | Admitting: Internal Medicine

## 2014-03-12 ENCOUNTER — Ambulatory Visit: Payer: Medicare PPO | Admitting: Neurology

## 2014-03-26 ENCOUNTER — Ambulatory Visit: Payer: Self-pay | Admitting: Adult Health

## 2014-03-27 ENCOUNTER — Ambulatory Visit: Payer: Medicare PPO | Admitting: Neurology

## 2014-03-28 ENCOUNTER — Encounter: Payer: Self-pay | Admitting: *Deleted

## 2014-04-09 ENCOUNTER — Ambulatory Visit (INDEPENDENT_AMBULATORY_CARE_PROVIDER_SITE_OTHER): Payer: Medicare PPO | Admitting: Adult Health

## 2014-04-09 ENCOUNTER — Encounter: Payer: Self-pay | Admitting: Adult Health

## 2014-04-09 VITALS — BP 131/50 | HR 60 | Ht 65.0 in | Wt 169.9 lb

## 2014-04-09 DIAGNOSIS — G3183 Dementia with Lewy bodies: Secondary | ICD-10-CM

## 2014-04-09 DIAGNOSIS — G309 Alzheimer's disease, unspecified: Secondary | ICD-10-CM

## 2014-04-09 DIAGNOSIS — F329 Major depressive disorder, single episode, unspecified: Secondary | ICD-10-CM

## 2014-04-09 DIAGNOSIS — F0150 Vascular dementia without behavioral disturbance: Secondary | ICD-10-CM

## 2014-04-09 DIAGNOSIS — F0283 Dementia in other diseases classified elsewhere, unspecified severity, with mood disturbance: Secondary | ICD-10-CM

## 2014-04-09 DIAGNOSIS — G301 Alzheimer's disease with late onset: Secondary | ICD-10-CM

## 2014-04-09 DIAGNOSIS — F0280 Dementia in other diseases classified elsewhere without behavioral disturbance: Secondary | ICD-10-CM

## 2014-04-09 NOTE — Progress Notes (Signed)
I agree with the assessment and plan as directed by NP .The patient is known to me .   Lamia Mariner, MD  
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
## 2014-04-09 NOTE — Patient Instructions (Signed)
Please look over her medication and let us know what exactly she is taking. You can drop off a list of medication at our office or Call and let roderick know what medication she is taking.

## 2014-04-17 ENCOUNTER — Telehealth: Payer: Self-pay | Admitting: Adult Health

## 2014-04-17 NOTE — Telephone Encounter (Signed)
The patient dropped off a corrected list of medication that she is taking. I have updated her medication list. The patient is only taking Namenda for memory loss.

## 2014-05-06 ENCOUNTER — Other Ambulatory Visit: Payer: Self-pay | Admitting: Neurology

## 2014-08-14 ENCOUNTER — Ambulatory Visit: Payer: Medicare PPO | Admitting: Neurology

## 2014-08-14 ENCOUNTER — Telehealth: Payer: Self-pay

## 2014-08-26 NOTE — Telephone Encounter (Signed)
No show

## 2014-10-14 ENCOUNTER — Emergency Department (HOSPITAL_COMMUNITY)
Admission: EM | Admit: 2014-10-14 | Discharge: 2014-10-14 | Disposition: A | Discharge status: Home / Self Care | Payer: Medicare HMO | Attending: Emergency Medicine | Admitting: Emergency Medicine

## 2014-10-14 ENCOUNTER — Encounter (HOSPITAL_COMMUNITY): Payer: Self-pay

## 2014-10-14 DIAGNOSIS — N189 Chronic kidney disease, unspecified: Secondary | ICD-10-CM | POA: Insufficient documentation

## 2014-10-14 DIAGNOSIS — I129 Hypertensive chronic kidney disease with stage 1 through stage 4 chronic kidney disease, or unspecified chronic kidney disease: Secondary | ICD-10-CM | POA: Insufficient documentation

## 2014-10-14 DIAGNOSIS — G309 Alzheimer's disease, unspecified: Secondary | ICD-10-CM | POA: Diagnosis not present

## 2014-10-14 DIAGNOSIS — Q249 Congenital malformation of heart, unspecified: Secondary | ICD-10-CM | POA: Insufficient documentation

## 2014-10-14 DIAGNOSIS — Z872 Personal history of diseases of the skin and subcutaneous tissue: Secondary | ICD-10-CM | POA: Insufficient documentation

## 2014-10-14 DIAGNOSIS — Z79899 Other long term (current) drug therapy: Secondary | ICD-10-CM | POA: Diagnosis not present

## 2014-10-14 DIAGNOSIS — G3183 Dementia with Lewy bodies: Secondary | ICD-10-CM | POA: Insufficient documentation

## 2014-10-14 DIAGNOSIS — N390 Urinary tract infection, site not specified: Secondary | ICD-10-CM | POA: Insufficient documentation

## 2014-10-14 DIAGNOSIS — M1990 Unspecified osteoarthritis, unspecified site: Secondary | ICD-10-CM | POA: Diagnosis not present

## 2014-10-14 DIAGNOSIS — F329 Major depressive disorder, single episode, unspecified: Secondary | ICD-10-CM | POA: Diagnosis not present

## 2014-10-14 DIAGNOSIS — E119 Type 2 diabetes mellitus without complications: Secondary | ICD-10-CM | POA: Insufficient documentation

## 2014-10-14 DIAGNOSIS — E039 Hypothyroidism, unspecified: Secondary | ICD-10-CM | POA: Diagnosis not present

## 2014-10-14 DIAGNOSIS — F0150 Vascular dementia without behavioral disturbance: Secondary | ICD-10-CM | POA: Diagnosis not present

## 2014-10-14 DIAGNOSIS — Z7982 Long term (current) use of aspirin: Secondary | ICD-10-CM | POA: Diagnosis not present

## 2014-10-14 DIAGNOSIS — R5383 Other fatigue: Secondary | ICD-10-CM | POA: Diagnosis present

## 2014-10-14 DIAGNOSIS — Z8719 Personal history of other diseases of the digestive system: Secondary | ICD-10-CM | POA: Insufficient documentation

## 2014-10-14 DIAGNOSIS — G43909 Migraine, unspecified, not intractable, without status migrainosus: Secondary | ICD-10-CM | POA: Insufficient documentation

## 2014-10-14 DIAGNOSIS — R2243 Localized swelling, mass and lump, lower limb, bilateral: Secondary | ICD-10-CM | POA: Insufficient documentation

## 2014-10-14 DIAGNOSIS — F0280 Dementia in other diseases classified elsewhere without behavioral disturbance: Secondary | ICD-10-CM | POA: Diagnosis not present

## 2014-10-14 DIAGNOSIS — E785 Hyperlipidemia, unspecified: Secondary | ICD-10-CM | POA: Diagnosis not present

## 2014-10-14 LAB — URINALYSIS, ROUTINE W REFLEX MICROSCOPIC
GLUCOSE, UA: NEGATIVE mg/dL
KETONES UR: 15 mg/dL — AB
Nitrite: NEGATIVE
PH: 5 (ref 5.0–8.0)
Protein, ur: 100 mg/dL — AB
Specific Gravity, Urine: 1.011 (ref 1.005–1.030)
Urobilinogen, UA: 1 mg/dL (ref 0.0–1.0)

## 2014-10-14 LAB — BASIC METABOLIC PANEL
ANION GAP: 9 (ref 5–15)
BUN: 44 mg/dL — ABNORMAL HIGH (ref 6–20)
CO2: 22 mmol/L (ref 22–32)
Calcium: 8.5 mg/dL — ABNORMAL LOW (ref 8.9–10.3)
Chloride: 101 mmol/L (ref 101–111)
Creatinine, Ser: 2.28 mg/dL — ABNORMAL HIGH (ref 0.44–1.00)
GFR calc Af Amer: 25 mL/min — ABNORMAL LOW (ref >60)
GFR calc non Af Amer: 21 mL/min — ABNORMAL LOW (ref >60)
Glucose, Bld: 119 mg/dL — ABNORMAL HIGH (ref 65–99)
POTASSIUM: 4.9 mmol/L (ref 3.5–5.1)
SODIUM: 132 mmol/L — AB (ref 135–145)

## 2014-10-14 LAB — CBC
HEMATOCRIT: 32 % — AB (ref 36.0–46.0)
Hemoglobin: 10.6 g/dL — ABNORMAL LOW (ref 12.0–15.0)
MCH: 30.8 pg (ref 26.0–34.0)
MCHC: 33.1 g/dL (ref 30.0–36.0)
MCV: 93 fL (ref 78.0–100.0)
Platelets: 236 10*3/uL (ref 150–400)
RBC: 3.44 MIL/uL — AB (ref 3.87–5.11)
RDW: 13.7 % (ref 11.5–15.5)
WBC: 6.8 10*3/uL (ref 4.0–10.5)

## 2014-10-14 LAB — CBG MONITORING, ED: Glucose-Capillary: 121 mg/dL — ABNORMAL HIGH (ref 65–99)

## 2014-10-14 LAB — URINE MICROSCOPIC-ADD ON

## 2014-10-14 MED ORDER — SODIUM CHLORIDE 0.9 % IV SOLN
INTRAVENOUS | Status: DC
  Administered 2014-10-14: 17:00:00 via INTRAVENOUS

## 2014-10-14 MED ORDER — SODIUM CHLORIDE 0.9 % IV BOLUS (SEPSIS)
500.0000 mL | Freq: Once | INTRAVENOUS | Status: AC
  Administered 2014-10-14: 500 mL via INTRAVENOUS

## 2014-10-14 MED ORDER — CEPHALEXIN 250 MG PO CAPS
500.0000 mg | ORAL_CAPSULE | Freq: Once | ORAL | Status: AC
  Administered 2014-10-14: 500 mg via ORAL
  Filled 2014-10-14: qty 2

## 2014-10-14 MED ORDER — CEPHALEXIN 500 MG PO CAPS: 500.0000 mg | ORAL_CAPSULE | Freq: Four times a day (QID) | ORAL | Status: DC

## 2014-10-14 NOTE — ED Notes (Signed)
Pt provided with apple sauce and is feeding self.  Husband remains in room and call bell is at bedside.

## 2014-10-14 NOTE — ED Notes (Signed)
Pt brought in by EMS for increased weakness and increased confusion x1 week.  Pt has a hx of Alzheimer's but pt husband reports that pt appetite has decreased and she hasn't been using her left hand as much as usual.  Husband reports a decline x3-4 weeks.

## 2014-10-14 NOTE — ED Provider Notes (Signed)
CSN: [id number]     Arrival date & time 10/14/14  1510 History   First MD Initiated Contact with Patient 10/14/14 1535     Chief Complaint  Patient presents with  . Fatigue     (Consider location/radiation/quality/duration/timing/severity/associated sxs/prior Treatment) HPI   [name] [name] is a 69 y.o. female with moderate dementia who lives in their home with her husband, who presents for evaluation of decreased oral intake for several days, associated with weakness, which is caused it to be hard to walk, and lower extremity swelling for 2 days. Husband also reports gradual onset of increasing difficulty with her ADLs, for several months. She last saw her PCP, 3 weeks ago. That was apparently a general checkup visit. She is taking her usual medications. There are no other known modifying factors.   Past Medical History  Diagnosis Date  . 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 Chronic kidney disease   . Alzheimer disease   . DM (diabetes mellitus)   . HLD (hyperlipidemia)   . Memory deficits   . Psychosis   . Major depression   . Hypertension   . Migraine   . GERD (gastroesophageal reflux disease)   . Eczema   . Hypothyroidism   . Rhinitis   . Arthritis   . Dementia of the Alzheimer's type, with late onset, with depressive mood 05/01/2013  . 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 Abdominal hysterectomy    . Stomach surgery      ?  Marland Kitchen. Thyroid surgery    . Dental surgery     Family History  Problem Relation Age of Onset  . Diabetes Mother   . Diabetes Brother   . Diabetes Brother   . Diabetes Sister   . Diabetes Sister   . Diabetes Sister   . Diabetes Sister   . Diabetes Sister   . Diabetes Sister    History  Substance Use Topics  . Smoking status: Never Smoker   . Smokeless tobacco: Never Used  . Alcohol Use: No   OB History    No data available     Review of  Systems  All other systems reviewed and are negative.     Allergies  Benazepril-hydrochlorothiazide  Home Medications   Prior to Admission medications   Medication Sig Start Date End Date Taking? Authorizing Provider  aspirin 81 MG tablet Take 81 mg by mouth daily.      Historical Provider, MD  atorvastatin (LIPITOR) 20 MG tablet Take 20 mg by mouth daily.      Historical Provider, MD  clonazePAM (KLONOPIN) 0.5 MG tablet Take 0.5 mg by mouth daily as needed for anxiety.    Historical Provider, MD  NAMENDA XR 28 MG CP24 24 hr capsule TAKE ONE CAPSULE BY MOUTH ONCE DAILY 05/06/14   Melvyn Novasarmen Dohmeier, MD  omega-3 acid ethyl esters (LOVAZA) 1 G capsule Take 1 g by mouth daily.    Historical Provider, MD  venlafaxine (EFFEXOR) 75 MG tablet Take 75 mg by mouth 2 (two) times daily.    Historical Provider, MD   BP 95/43 mmHg  Pulse 68  Temp(Src) 99.8 F (37.7 C) (Oral)  Resp 17  Ht 5\' 3"  (1.6 m)  Wt 165 lb (74.844 kg)  BMI 29.24 kg/m2  SpO2 99% Physical Exam  Constitutional: She appears well-developed.  Elderly, frail  HENT:  Head: Normocephalic and atraumatic.  Right Ear:  External ear normal.  Left Ear: External ear normal.  Eyes: Conjunctivae and EOM are normal. Pupils are equal, round, and reactive to light.  Neck: Normal range of motion and phonation normal. Neck supple.  Cardiovascular: Normal rate, regular rhythm and normal heart sounds.   Pulmonary/Chest: Effort normal and breath sounds normal. She exhibits no bony tenderness.  Abdominal: Soft. There is no tenderness.  Musculoskeletal: Normal range of motion. She exhibits edema (1-2+ lower extremity is, bilaterally.). She exhibits no tenderness.  Neurological: She is alert. No cranial nerve deficit or sensory deficit. She exhibits normal muscle tone. Coordination normal.  She follows commands well. She is oriented only to person. No dysarthria and aphasia or nystagmus.  Skin: Skin is warm, dry and intact.  Psychiatric: She  has a normal mood and affect. Her behavior is normal.  Nursing note and vitals reviewed.   ED Course  Procedures (including critical care time)  Nonspecific symptoms with general malaise, and likely worsening general medical status, secondary to dementia.  Medications  sodium chloride 0.9 % bolus 500 mL (not administered)  0.9 %  sodium chloride infusion (not administered)    Patient Vitals for the past 24 hrs:  BP Temp Temp src Pulse Resp SpO2 Height Weight  10/14/14 1533 (!) 95/43 mmHg 99.8 F (37.7 C) Oral 68 17 99 % - -  10/14/14 1530 (!) 95/43 mmHg - - 67 14 100 % - -  10/14/14 1520 (!) 90/45 mmHg 99.8 F (37.7 C) Oral 69 11 99 % 5\' 3"  (1.6 m) 165 lb (74.844 kg)  10/14/14 1517 - - - - - 100 % - -    9:02 PM Reevaluation with update and discussion. After initial assessment and treatment, an updated evaluation reveals she was able, and delay without assistance. She's been able to tolerate nutrition, orally. She was treated with Keflex in the ED. Nhu Glasby L   Labs Review Labs Reviewed  CBC - Abnormal; Notable for the following:    RBC 3.44 (*)    Hemoglobin 10.6 (*)    HCT 32.0 (*)    All other components within normal limits  BASIC METABOLIC PANEL - Abnormal; Notable for the following:    Sodium 132 (*)    Glucose, Bld 119 (*)    BUN 44 (*)    Creatinine, Ser 2.28 (*)    Calcium 8.5 (*)    GFR calc non Af Amer 21 (*)    GFR calc Af Amer 25 (*)    All other components within normal limits  CBG MONITORING, ED - Abnormal; Notable for the following:    Glucose-Capillary 121 (*)    All other components within normal limits  URINE CULTURE  URINALYSIS, ROUTINE W REFLEX MICROSCOPIC (NOT AT [facility])    Imaging Review No results found.   EKG Interpretation None        Date: 10/14/14-MUSE hyperlink is inactive  Rate: 67  Rhythm: normal sinus rhythm  QRS Axis: normal  PR and QT Intervals: normal  ST/T Wave abnormalities: nonspecific ST changes  PR and QRS  Conduction Disutrbances:none  Narrative Interpretation:   Old EKG Reviewed: unchanged- 10/10/13   MDM   Final diagnoses:  Urinary tract infection without hematuria, site unspecified    Urinary tract infection without severe sepsis, metabolic instability or suspicion for impending vascular collapse.  Nursing Notes Reviewed/ Care Coordinated Applicable Imaging Reviewed Interpretation of Laboratory Data incorporated into ED treatment  The patient appears reasonably screened and/or stabilized for discharge and I doubt  any other medical condition or other[facility] requiring further screening, evaluation, or treatment in the ED at this time prior to discharge.  Plan: Home Medications- Keflex; Home Treatments- rest, Fluids; return here if the recommended treatment, does not improve the symptoms; Recommended follow up- PCP 1 week and prn     Mancel Bale, MD 10/14/14 2103

## 2014-10-14 NOTE — ED Notes (Signed)
Discharge vitals completed, IV removed 

## 2014-10-14 NOTE — ED Notes (Signed)
Pt cleaned and dressed by RN and NT; Pt awaiting husband's arrival of discharge

## 2014-10-14 NOTE — Discharge Instructions (Signed)

## 2014-10-14 NOTE — ED Notes (Signed)
MD at bedside. 

## 2014-10-16 LAB — URINE CULTURE: SPECIAL REQUESTS: NORMAL

## 2014-10-17 ENCOUNTER — Telehealth (HOSPITAL_COMMUNITY): Payer: Self-pay

## 2014-10-17 NOTE — Telephone Encounter (Signed)
Post ED Visit - Positive Culture Follow-up  Culture report reviewed by antimicrobial stewardship pharmacist: []  Wes Dulaney, Pharm.D., BCPS []  Celedonio MiyamotoJeremy Frens, Pharm.D., BCPS []  Georgina PillionElizabeth Martin, [address] BCPS []  CommerceMinh Pham, [address]Ph[address]D., BCPS, AAHIVP []  Estella HuskMichelle Turner, Pharm.D., BCPS, AAHIVP []  Elder CyphersLorie Poole, [address] BCPS X  Isaac BlissMichael Maccia, RPh.  Positive Urine culture, >/= 100,000 colonies -> E Coli Treated with Cephalexin, organism sensitive to the same and no further patient follow-up is required at this time.  Arvid RightClark, Gwynn Chalker Dorn 10/17/2014, 1:48 PM

## 2014-10-24 ENCOUNTER — Encounter: Payer: Self-pay | Admitting: *Deleted

## 2014-10-30 ENCOUNTER — Encounter: Payer: Self-pay | Admitting: Cardiovascular Disease

## 2014-12-01 ENCOUNTER — Other Ambulatory Visit: Payer: Self-pay | Admitting: Neurology

## 2014-12-01 NOTE — Telephone Encounter (Signed)
No showed last appt  

## 2015-01-06 ENCOUNTER — Other Ambulatory Visit: Payer: Self-pay | Admitting: Neurology

## 2015-01-07 NOTE — Telephone Encounter (Signed)
Will not refill again until appt is made. Attempted to call pt to make an appt, no answer, no VM set up.

## 2015-01-08 ENCOUNTER — Other Ambulatory Visit: Payer: Self-pay

## 2015-01-08 MED ORDER — MEMANTINE HCL ER 28 MG PO CP24: 28.0000 mg | ORAL_CAPSULE | Freq: Every day | ORAL | Status: DC

## 2015-01-08 NOTE — Progress Notes (Signed)
Pt came by office to make an appt, requesting namenda be refilled. Refill sent.

## 2015-01-11 ENCOUNTER — Emergency Department (HOSPITAL_COMMUNITY): Payer: Medicare HMO

## 2015-01-11 ENCOUNTER — Encounter (HOSPITAL_COMMUNITY): Payer: Self-pay

## 2015-01-11 ENCOUNTER — Emergency Department (HOSPITAL_COMMUNITY)
Admission: EM | Admit: 2015-01-11 | Discharge: 2015-01-11 | Disposition: A | Discharge status: Home / Self Care | Payer: Medicare HMO | Attending: Emergency Medicine | Admitting: Emergency Medicine

## 2015-01-11 DIAGNOSIS — G309 Alzheimer's disease, unspecified: Secondary | ICD-10-CM | POA: Diagnosis not present

## 2015-01-11 DIAGNOSIS — E860 Dehydration: Secondary | ICD-10-CM | POA: Insufficient documentation

## 2015-01-11 DIAGNOSIS — F0390 Unspecified dementia without behavioral disturbance: Secondary | ICD-10-CM

## 2015-01-11 DIAGNOSIS — Z7982 Long term (current) use of aspirin: Secondary | ICD-10-CM | POA: Diagnosis not present

## 2015-01-11 DIAGNOSIS — F0280 Dementia in other diseases classified elsewhere without behavioral disturbance: Secondary | ICD-10-CM | POA: Insufficient documentation

## 2015-01-11 DIAGNOSIS — R011 Cardiac murmur, unspecified: Secondary | ICD-10-CM | POA: Diagnosis not present

## 2015-01-11 DIAGNOSIS — I129 Hypertensive chronic kidney disease with stage 1 through stage 4 chronic kidney disease, or unspecified chronic kidney disease: Secondary | ICD-10-CM | POA: Insufficient documentation

## 2015-01-11 DIAGNOSIS — Z79899 Other long term (current) drug therapy: Secondary | ICD-10-CM | POA: Diagnosis not present

## 2015-01-11 DIAGNOSIS — M1990 Unspecified osteoarthritis, unspecified site: Secondary | ICD-10-CM | POA: Insufficient documentation

## 2015-01-11 DIAGNOSIS — R2243 Localized swelling, mass and lump, lower limb, bilateral: Secondary | ICD-10-CM

## 2015-01-11 DIAGNOSIS — Z872 Personal history of diseases of the skin and subcutaneous tissue: Secondary | ICD-10-CM | POA: Diagnosis not present

## 2015-01-11 DIAGNOSIS — Z8719 Personal history of other diseases of the digestive system: Secondary | ICD-10-CM | POA: Insufficient documentation

## 2015-01-11 DIAGNOSIS — F329 Major depressive disorder, single episode, unspecified: Secondary | ICD-10-CM | POA: Insufficient documentation

## 2015-01-11 DIAGNOSIS — M7989 Other specified soft tissue disorders: Secondary | ICD-10-CM | POA: Diagnosis present

## 2015-01-11 DIAGNOSIS — E119 Type 2 diabetes mellitus without complications: Secondary | ICD-10-CM | POA: Diagnosis not present

## 2015-01-11 DIAGNOSIS — N189 Chronic kidney disease, unspecified: Secondary | ICD-10-CM | POA: Insufficient documentation

## 2015-01-11 DIAGNOSIS — E785 Hyperlipidemia, unspecified: Secondary | ICD-10-CM | POA: Diagnosis not present

## 2015-01-11 DIAGNOSIS — Q248 Other specified congenital malformations of heart: Secondary | ICD-10-CM | POA: Insufficient documentation

## 2015-01-11 LAB — I-STAT CHEM 8, ED
BUN: 38 mg/dL — AB (ref 6–20)
CHLORIDE: 99 mmol/L — AB (ref 101–111)
Calcium, Ion: 1.17 mmol/L (ref 1.13–1.30)
Creatinine, Ser: 1.8 mg/dL — ABNORMAL HIGH (ref 0.44–1.00)
Glucose, Bld: 103 mg/dL — ABNORMAL HIGH (ref 65–99)
HEMATOCRIT: 34 % — AB (ref 36.0–46.0)
Hemoglobin: 11.6 g/dL — ABNORMAL LOW (ref 12.0–15.0)
POTASSIUM: 4.8 mmol/L (ref 3.5–5.1)
Sodium: 136 mmol/L (ref 135–145)
TCO2: 27 mmol/L (ref 0–100)

## 2015-01-11 LAB — URINALYSIS W MICROSCOPIC (NOT AT ARMC)
BILIRUBIN URINE: NEGATIVE
GLUCOSE, UA: NEGATIVE mg/dL
HGB URINE DIPSTICK: NEGATIVE
KETONES UR: NEGATIVE mg/dL
NITRITE: NEGATIVE
PH: 7.5 (ref 5.0–8.0)
PROTEIN: NEGATIVE mg/dL
Specific Gravity, Urine: 1.013 (ref 1.005–1.030)
UROBILINOGEN UA: 0.2 mg/dL (ref 0.0–1.0)

## 2015-01-11 LAB — POTASSIUM: Potassium: 5.4 mmol/L — ABNORMAL HIGH (ref 3.5–5.1)

## 2015-01-11 LAB — COMPREHENSIVE METABOLIC PANEL
ALK PHOS: 83 U/L (ref 38–126)
ALT: 25 U/L (ref 14–54)
AST: 31 U/L (ref 15–41)
Albumin: 3.5 g/dL (ref 3.5–5.0)
Anion gap: 10 (ref 5–15)
BUN: 31 mg/dL — AB (ref 6–20)
CALCIUM: 9.3 mg/dL (ref 8.9–10.3)
CO2: 28 mmol/L (ref 22–32)
Chloride: 98 mmol/L — ABNORMAL LOW (ref 101–111)
Creatinine, Ser: 1.81 mg/dL — ABNORMAL HIGH (ref 0.44–1.00)
GFR calc Af Amer: 32 mL/min — ABNORMAL LOW (ref >60)
GFR, EST NON AFRICAN AMERICAN: 28 mL/min — AB (ref >60)
Glucose, Bld: 106 mg/dL — ABNORMAL HIGH (ref 65–99)
POTASSIUM: 5.3 mmol/L — AB (ref 3.5–5.1)
Sodium: 136 mmol/L (ref 135–145)
TOTAL PROTEIN: 6.9 g/dL (ref 6.5–8.1)
Total Bilirubin: 0.6 mg/dL (ref 0.3–1.2)

## 2015-01-11 LAB — CBC
HEMATOCRIT: 30.4 % — AB (ref 36.0–46.0)
Hemoglobin: 9.7 g/dL — ABNORMAL LOW (ref 12.0–15.0)
MCH: 30.2 pg (ref 26.0–34.0)
MCHC: 31.9 g/dL (ref 30.0–36.0)
MCV: 94.7 fL (ref 78.0–100.0)
Platelets: 257 10*3/uL (ref 150–400)
RBC: 3.21 MIL/uL — ABNORMAL LOW (ref 3.87–5.11)
RDW: 14.2 % (ref 11.5–15.5)
WBC: 5.8 10*3/uL (ref 4.0–10.5)

## 2015-01-11 LAB — I-STAT CG4 LACTIC ACID, ED
LACTIC ACID, VENOUS: 2.11 mmol/L — AB (ref 0.5–2.0)
LACTIC ACID, VENOUS: 1.24 mmol/L (ref 0.5–2.0)

## 2015-01-11 LAB — I-STAT TROPONIN, ED: Troponin i, poc: 0.01 ng/mL (ref 0.00–0.08)

## 2015-01-11 MED ORDER — SODIUM CHLORIDE 0.9 % IV BOLUS (SEPSIS)
250.0000 mL | Freq: Once | INTRAVENOUS | Status: AC
Start: 2015-01-11 — End: 2015-01-11

## 2015-01-11 MED ORDER — SODIUM CHLORIDE 0.9 % IV BOLUS (SEPSIS)
250.0000 mL | Freq: Once | INTRAVENOUS | Status: AC
Start: 2015-01-11 — End: 2015-01-11
  Administered 2015-01-11: 250 mL via INTRAVENOUS

## 2015-01-11 MED ORDER — SODIUM CHLORIDE 0.9 % IV SOLN
INTRAVENOUS | Status: DC
  Administered 2015-01-11: 12:00:00 via INTRAVENOUS

## 2015-01-11 NOTE — ED Provider Notes (Signed)
CSN: [id number]     Arrival date & time 01/11/15  4098 History   First MD Initiated Contact with Patient 01/11/15 [phone]     Chief Complaint  Patient presents with  . Altered Mental Status  . Leg Swelling     (Consider location/radiation/quality/duration/timing/severity/associated sxs/prior Treatment) Patient is a 69 y.o. female presenting with altered mental status. The history is provided by the EMS personnel and a caregiver. The history is limited by the condition of the patient.  Altered Mental Status  level V caveat applies due to patient's history of dementia.  Patient brought in by EMS for increased confusion over the past several days and increased leg swelling. Patient has a history of dementia but caregiver states that more confused than usual. No specific symptoms. No specific shortness of breath chest pain abdominal pain nausea vomiting or diarrhea.  Past Medical History  Diagnosis Date  . 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 Chronic kidney disease   . Alzheimer disease   . DM (diabetes mellitus) (HCC)   . HLD (hyperlipidemia)   . Memory deficits   . Psychosis   . Major depression (HCC)   . Hypertension   . Migraine   . GERD (gastroesophageal reflux disease)   . Eczema   . Hypothyroidism   . Rhinitis   . Arthritis   . Dementia of the Alzheimer's type, with late onset, with depressive mood 05/01/2013  . 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 Heart murmur    Past Surgical History  Procedure Laterality Date  . Abdominal hysterectomy    . Stomach surgery      ?  Marland Kitchen Thyroid surgery    . Dental surgery     Family History  Problem Relation Age of Onset  . Diabetes Mother   . Diabetes Brother   . Diabetes Brother   . Diabetes Sister   . Diabetes Sister   . Diabetes Sister   . Diabetes Sister   . Diabetes Sister   . Diabetes Sister    Social History  Substance Use Topics  . Smoking status: Never Smoker   . Smokeless  tobacco: Never Used  . Alcohol Use: No   OB History    No data available     Review of Systems  Unable to perform ROS  level V caveat applies due to dementia.    Allergies  Benazepril-hydrochlorothiazide  Home Medications   Prior to Admission medications   Medication Sig Start Date End Date Taking? Authorizing Provider  aspirin 81 MG tablet Take 81 mg by mouth daily.      Historical Provider, MD  atorvastatin (LIPITOR) 20 MG tablet Take 20 mg by mouth daily.      Historical Provider, MD  cephALEXin (KEFLEX) 500 MG capsule Take 1 capsule (500 mg total) by mouth 4 (four) times daily. 10/14/14   Mancel Bale, MD  clonazePAM (KLONOPIN) 0.5 MG tablet Take 0.5 mg by mouth daily as needed for anxiety.    Historical Provider, MD  memantine (NAMENDA XR) 28 MG CP24 24 hr capsule Take 1 capsule (28 mg total) by mouth daily. 01/08/15   Porfirio Mylar Dohmeier, MD  omega-3 acid ethyl esters (LOVAZA) 1 G capsule Take 1 g by mouth daily.    Historical Provider, MD  venlafaxine (EFFEXOR) 75 MG tablet Take 75 mg by mouth 2 (two) times daily.    Historical Provider, MD   BP 134/48 mmHg  Pulse 62  Temp(Src) 97.7 F (  36.5 C) (Oral)  Resp 17  SpO2 100% Physical Exam  Constitutional: She appears well-developed and well-nourished. No distress.  HENT:  Head: Normocephalic and atraumatic.  Mouth/Throat: Oropharynx is clear and moist.  Eyes: Conjunctivae and EOM are normal. Pupils are equal, round, and reactive to light.  Neck: Normal range of motion. Neck supple.  Cardiovascular: Normal rate, regular rhythm and normal heart sounds.   No murmur heard. Pulmonary/Chest: Effort normal and breath sounds normal. No respiratory distress.  Abdominal: Soft. Bowel sounds are normal. There is no tenderness.  Musculoskeletal: She exhibits edema.  Neurological: She is alert.  Cooperative with neuro exam however did move both upper extremities. Patient able to open eyes.  Skin: Skin is warm. No rash noted.  Nursing  note and vitals reviewed.   ED Course  Procedures (including critical care time) Labs Review Labs Reviewed  COMPREHENSIVE METABOLIC PANEL - Abnormal; Notable for the following:    Potassium 5.3 (*)    Chloride 98 (*)    Glucose, Bld 106 (*)    BUN 31 (*)    Creatinine, Ser 1.81 (*)    GFR calc non Af Amer 28 (*)    GFR calc Af Amer 32 (*)    All other components within normal limits  CBC - Abnormal; Notable for the following:    RBC 3.21 (*)    Hemoglobin 9.7 (*)    HCT 30.4 (*)    All other components within normal limits  URINALYSIS W MICROSCOPIC - Abnormal; Notable for the following:    APPearance CLOUDY (*)    Leukocytes, UA TRACE (*)    All other components within normal limits  POTASSIUM - Abnormal; Notable for the following:    Potassium 5.4 (*)    All other components within normal limits  I-STAT CG4 LACTIC ACID, ED - Abnormal; Notable for the following:    Lactic Acid, Venous 2.11 (*)    All other components within normal limits  I-STAT CHEM 8, ED - Abnormal; Notable for the following:    Chloride 99 (*)    BUN 38 (*)    Creatinine, Ser 1.80 (*)    Glucose, Bld 103 (*)    Hemoglobin 11.6 (*)    HCT 34.0 (*)    All other components within normal limits  LIPASE, BLOOD  I-STAT TROPOININ, ED  I-STAT CG4 LACTIC ACID, ED  I-STAT CG4 LACTIC ACID, ED   Results for orders placed or performed during the hospital encounter of 01/11/15  Comprehensive metabolic panel  Result Value Ref Range   Sodium 136 135 - 145 mmol/L   Potassium 5.3 (H) 3.5 - 5.1 mmol/L   Chloride 98 (L) 101 - 111 mmol/L   CO2 28 22 - 32 mmol/L   Glucose, Bld 106 (H) 65 - 99 mg/dL   BUN 31 (H) 6 - 20 mg/dL   Creatinine, Ser 1.61 (H) 0.44 - 1.00 mg/dL   Calcium 9.3 8.9 - 09.6 mg/dL   Total Protein 6.9 6.5 - 8.1 g/dL   Albumin 3.5 3.5 - 5.0 g/dL   AST 31 15 - 41 U/L   ALT 25 14 - 54 U/L   Alkaline Phosphatase 83 38 - 126 U/L   Total Bilirubin 0.6 0.3 - 1.2 mg/dL   GFR calc non Af Amer 28 (L)  >60 mL/min   GFR calc Af Amer 32 (L) >60 mL/min   Anion gap 10 5 - 15  CBC  Result Value Ref Range   WBC 5.8  4.0 - 10.5 K/uL   RBC 3.21 (L) 3.87 - 5.11 MIL/uL   Hemoglobin 9.7 (L) 12.0 - 15.0 g/dL   HCT 16.1 (L) 09.6 - 04.5 %   MCV 94.7 78.0 - 100.0 fL   MCH 30.2 26.0 - 34.0 pg   MCHC 31.9 30.0 - 36.0 g/dL   RDW 40.9 81.1 - 91.4 %   Platelets 257 150 - 400 K/uL  Urinalysis with microscopic  Result Value Ref Range   Color, Urine YELLOW YELLOW   APPearance CLOUDY (A) CLEAR   Specific Gravity, Urine 1.013 1.005 - 1.030   pH 7.5 5.0 - 8.0   Glucose, UA NEGATIVE NEGATIVE mg/dL   Hgb urine dipstick NEGATIVE NEGATIVE   Bilirubin Urine NEGATIVE NEGATIVE   Ketones, ur NEGATIVE NEGATIVE mg/dL   Protein, ur NEGATIVE NEGATIVE mg/dL   Urobilinogen, UA 0.2 0.0 - 1.0 mg/dL   Nitrite NEGATIVE NEGATIVE   Leukocytes, UA TRACE (A) NEGATIVE   WBC, UA 3-6 <3 WBC/hpf   Squamous Epithelial / LPF RARE RARE  Potassium  Result Value Ref Range   Potassium 5.4 (H) 3.5 - 5.1 mmol/L  I-Stat Troponin, ED (not at [facility])  Result Value Ref Range   Troponin i, poc 0.01 0.00 - 0.08 ng/mL   Comment 3          I-Stat CG4 Lactic Acid, ED  Result Value Ref Range   Lactic Acid, Venous 2.11 (HH) 0.5 - 2.0 mmol/L   Comment NOTIFIED PHYSICIAN   I-Stat CG4 Lactic Acid, ED  Result Value Ref Range   Lactic Acid, Venous 1.24 0.5 - 2.0 mmol/L  I-Stat Chem 8, ED  Result Value Ref Range   Sodium 136 135 - 145 mmol/L   Potassium 4.8 3.5 - 5.1 mmol/L   Chloride 99 (L) 101 - 111 mmol/L   BUN 38 (H) 6 - 20 mg/dL   Creatinine, Ser 7.82 (H) 0.44 - 1.00 mg/dL   Glucose, Bld 956 (H) 65 - 99 mg/dL   Calcium, Ion 2.13 0.86 - 1.30 mmol/L   TCO2 27 0 - 100 mmol/L   Hemoglobin 11.6 (L) 12.0 - 15.0 g/dL   HCT 57.8 (L) 46.9 - 62.9 %    Imaging Review Dg Chest 1 View  01/11/2015   CLINICAL DATA:  Confusion with loss of appetite.  Hypertension.  EXAM: CHEST 1 VIEW  COMPARISON:  January 09, 2014  FINDINGS: Lungs are clear.  Heart size and pulmonary vascularity are normal. No adenopathy. No bone lesions. There is degenerative change in the thoracic spine.  IMPRESSION: No edema or consolidation.   Electronically Signed   By: Bretta Bang III M.D.   On: 01/11/2015 11:44   Ct Head Wo Contrast  01/11/2015   CLINICAL DATA:  Altered mental status with increased confusion. History of underlying dementia.  EXAM: CT HEAD WITHOUT CONTRAST  TECHNIQUE: Contiguous axial images were obtained from the base of the skull through the vertex without intravenous contrast.  COMPARISON:  January 09, 2014  FINDINGS: Moderate diffuse atrophy is stable. There is no intracranial mass, hemorrhage, extra-axial fluid collection, or midline shift. There is mild patchy small vessel disease in the centra semiovale bilaterally as well as in the mid pons in the basilar perforator distribution. No acute infarct evident. No new gray-white compartment lesion. The bony calvarium appears intact. The mastoid air cells are clear.  IMPRESSION: Stable generalized atrophy. Stable supratentorial and infratentorial small vessel disease. No acute infarct evident. No hemorrhage or mass effect.   Electronically  Signed   By: Bretta Bang III M.D.   On: 01/11/2015 10:59   I have personally reviewed and evaluated these images and lab results as part of my medical decision-making.   EKG Interpretation   Date/Time:  Sunday January 11 2015 10:10:02 EDT Ventricular Rate:  60 PR Interval:  178 QRS Duration: 89 QT Interval:  426 QTC Calculation: 426 R Axis:   14 Text Interpretation:  Sinus rhythm No significant change since last  tracing Confirmed by Ellinor Test  MD, Keaton Beichner ([id number]) on 01/11/2015 10:15:51 AM      MDM   Final diagnoses:  Dementia, without behavioral disturbance  Dehydration  Localized swelling of both lower legs    Patient brought in by EMS along with caregiver from home. Patient has a history of dementia but caregiver says that increased  confusion the past few days. Patient also would not much of a desire to eat or drink.  Workup here without any significant findings. Initially lactic acid was slightly elevated, but vital sign showed no evidence of fever, tachycardia, or hypotension.  After fluids patients potassium and lactic acid returned to normal. Patient has some renal insufficiency. No evidence urinary tract infection chest x-rays negative. The bilateral leg swelling does not seem to be related to renal failure or congestive heart failure. EKG without acute changes. Labs otherwise without any significant abnormalities.  Patient is stable for discharge home with follow-up with primary care doctor.    Vanetta Mulders, MD 01/11/15 1323

## 2015-01-11 NOTE — Discharge Instructions (Signed)
Workup without any significant findings. Initial lab abnormalities were corrected with fluids. Make an appointment to follow-up with your regular doctor. Return for any new or worse symptoms.

## 2015-01-11 NOTE — ED Notes (Signed)
Caregiver updated on plan of care and discharge instructions. Patient dressed and waiting for [facility]

## 2015-01-11 NOTE — ED Notes (Signed)
Per EMS: Pt from home with increased confusion and lower leg edema x 1 week. Hx of dementia, but per caretaker confusion increased. Oriented to self. VSS.

## 2015-02-05 ENCOUNTER — Ambulatory Visit: Payer: Medicare PPO | Admitting: Neurology

## 2015-02-06 ENCOUNTER — Encounter: Payer: Self-pay | Admitting: Neurology

## 2015-02-10 NOTE — Telephone Encounter (Signed)
This encounter was created in error - please disregard.

## 2015-02-19 ENCOUNTER — Emergency Department (HOSPITAL_COMMUNITY)
Admission: EM | Admit: 2015-02-19 | Discharge: 2015-02-19 | Disposition: A | Discharge status: Home / Self Care | Payer: Medicare HMO | Attending: Emergency Medicine | Admitting: Emergency Medicine

## 2015-02-19 ENCOUNTER — Encounter (HOSPITAL_COMMUNITY): Payer: Self-pay | Admitting: Neurology

## 2015-02-19 DIAGNOSIS — Z79899 Other long term (current) drug therapy: Secondary | ICD-10-CM | POA: Diagnosis not present

## 2015-02-19 DIAGNOSIS — L89611 Pressure ulcer of right heel, stage 1: Secondary | ICD-10-CM | POA: Diagnosis not present

## 2015-02-19 DIAGNOSIS — N189 Chronic kidney disease, unspecified: Secondary | ICD-10-CM | POA: Insufficient documentation

## 2015-02-19 DIAGNOSIS — G43909 Migraine, unspecified, not intractable, without status migrainosus: Secondary | ICD-10-CM | POA: Diagnosis not present

## 2015-02-19 DIAGNOSIS — R011 Cardiac murmur, unspecified: Secondary | ICD-10-CM | POA: Insufficient documentation

## 2015-02-19 DIAGNOSIS — E785 Hyperlipidemia, unspecified: Secondary | ICD-10-CM | POA: Insufficient documentation

## 2015-02-19 DIAGNOSIS — E119 Type 2 diabetes mellitus without complications: Secondary | ICD-10-CM | POA: Insufficient documentation

## 2015-02-19 DIAGNOSIS — F0390 Unspecified dementia without behavioral disturbance: Secondary | ICD-10-CM | POA: Diagnosis not present

## 2015-02-19 DIAGNOSIS — M1990 Unspecified osteoarthritis, unspecified site: Secondary | ICD-10-CM | POA: Diagnosis not present

## 2015-02-19 DIAGNOSIS — I129 Hypertensive chronic kidney disease with stage 1 through stage 4 chronic kidney disease, or unspecified chronic kidney disease: Secondary | ICD-10-CM | POA: Diagnosis not present

## 2015-02-19 DIAGNOSIS — Z8719 Personal history of other diseases of the digestive system: Secondary | ICD-10-CM | POA: Diagnosis not present

## 2015-02-19 DIAGNOSIS — Q248 Other specified congenital malformations of heart: Secondary | ICD-10-CM | POA: Insufficient documentation

## 2015-02-19 DIAGNOSIS — Z872 Personal history of diseases of the skin and subcutaneous tissue: Secondary | ICD-10-CM | POA: Insufficient documentation

## 2015-02-19 DIAGNOSIS — Z7982 Long term (current) use of aspirin: Secondary | ICD-10-CM | POA: Insufficient documentation

## 2015-02-19 DIAGNOSIS — Z792 Long term (current) use of antibiotics: Secondary | ICD-10-CM | POA: Insufficient documentation

## 2015-02-19 DIAGNOSIS — L8990 Pressure ulcer of unspecified site, unspecified stage: Secondary | ICD-10-CM

## 2015-02-19 DIAGNOSIS — F329 Major depressive disorder, single episode, unspecified: Secondary | ICD-10-CM | POA: Diagnosis not present

## 2015-02-19 DIAGNOSIS — Z4800 Encounter for change or removal of nonsurgical wound dressing: Secondary | ICD-10-CM | POA: Diagnosis present

## 2015-02-19 NOTE — ED Notes (Addendum)
Per ems-pt comes from home with dementia and is bed bound. Her husband called EMS due to her having pressure sore to bilateral heels and an almost healed sacral ulcer. There is no acute change to ulcers but wants them evaluated. They have been there for 3 weeks. BP 110/60, HR 68. Pt is here with her aid and husband. Has not been to PCP because it is difficult to have her transported there.

## 2015-02-19 NOTE — Care Management Note (Signed)
Case Management Note  Patient Details  Name: [name] [name] MRN: [id number] Date of Birth: [birth year]  Subjective/Objective:                  Per ems-pt comes from home with dementia and is bed bound. Her husband called EMS due to her having pressure sore to bilateral heels and an almost healed sacral ulcer. //Home with spouse.  Action/Plan: Follow for disposition needs.   Expected Discharge Date:           02/19/2015       Expected Discharge Plan:  Home w Home Health Services  In-House Referral:  NA  Discharge planning Services  CM Consult  Post Acute Care Choice:  Home Health Choice offered to:  Spouse  DME Arranged:  N/A DME Agency:  [facility]  HH Arranged:  RN, [facility] Agency:  [facility]  Status of Service:  Completed, signed off  Medicare Important Message Given:    Date Medicare IM Given:    Medicare IM give by:    Date Additional Medicare IM Given:    Additional Medicare Important Message give by:     If discussed at Long Length of Stay Meetings, dates discussed:    Additional Comments: CM spoke with patient spouse [name] Fearing(James) concerning discharge planning. [facility]. [facility]  [facility] rep  contacted concerning new referral. Pt to discharge home 02/19/2015.  No DME needs stated at this time.    [name] [name], [name] Wehrly, RN 02/19/2015, 1:39 PM

## 2015-02-19 NOTE — ED Notes (Signed)
[facility] has been called  

## 2015-02-19 NOTE — ED Provider Notes (Addendum)
CSN: [id number]     Arrival date & time 02/19/15  1153 History   First MD Initiated Contact with Patient 02/19/15 1204     Chief Complaint  Patient presents with  . Wound Check     (Consider location/radiation/quality/duration/timing/severity/associated sxs/prior Treatment) HPI   69 year old female with multiple medical problems including diabetes, kidney disease and Alzheimer's presents to the emergency department secondary to heel wounds.has a sacral ulcer as well and is bedbound secondary to her dementia. She has a personal care a  Past Medical History  Diagnosis Date  . 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 Chronic kidney disease   . Alzheimer disease   . DM (diabetes mellitus) (HCC)   . HLD (hyperlipidemia)   . Memory deficits   . Psychosis   . Major depression (HCC)   . Hypertension   . Migraine   . GERD (gastroesophageal reflux disease)   . Eczema   . Hypothyroidism   . Rhinitis   . Arthritis   . Dementia of the Alzheimer's type, with late onset, with depressive mood 05/01/2013  . 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 Heart murmur    Past Surgical History  Procedure Laterality Date  . Abdominal hysterectomy    . Stomach surgery      ?  Marland Kitchen. Thyroid surgery    . Dental surgery     Family History  Problem Relation Age of Onset  . Diabetes Mother   . Diabetes Brother   . Diabetes Brother   . Diabetes Sister   . Diabetes Sister   . Diabetes Sister   . Diabetes Sister   . Diabetes Sister   . Diabetes Sister    Social History  Substance Use Topics  . Smoking status: Never Smoker   . Smokeless tobacco: Never Used  . Alcohol Use: No   OB History    No data available     Review of Systems  Constitutional: Negative for fever, activity change, appetite change and fatigue.  Gastrointestinal: Negative for nausea and vomiting.  Skin: Positive for wound. Negative for pallor and rash.  All other systems reviewed and  are negative.     Allergies  Benazepril-hydrochlorothiazide  Home Medications   Prior to Admission medications   Medication Sig Start Date End Date Taking? Authorizing Provider  aspirin 81 MG tablet Take 81 mg by mouth daily.      Historical Provider, MD  atorvastatin (LIPITOR) 20 MG tablet Take 20 mg by mouth daily.      Historical Provider, MD  cephALEXin (KEFLEX) 500 MG capsule Take 1 capsule (500 mg total) by mouth 4 (four) times daily. 10/14/14   Mancel BaleElliott Wentz, MD  clonazePAM (KLONOPIN) 0.5 MG tablet Take 0.5 mg by mouth daily as needed for anxiety.    Historical Provider, MD  memantine (NAMENDA XR) 28 MG CP24 24 hr capsule Take 1 capsule (28 mg total) by mouth daily. 01/08/15   Porfirio Mylararmen Dohmeier, MD  omega-3 acid ethyl esters (LOVAZA) 1 G capsule Take 1 g by mouth daily.    Historical Provider, MD  venlafaxine (EFFEXOR) 75 MG tablet Take 75 mg by mouth 2 (two) times daily.    Historical Provider, MD   BP 94/47 mmHg  Pulse 59  Temp(Src) 98.6 F (37 C) (Oral)  Resp 12  SpO2 97% Physical Exam  Constitutional: She appears well-developed and well-nourished.  HENT:  Head: Normocephalic and atraumatic.  Neck: Normal range of motion.  Cardiovascular: Normal  rate and regular rhythm.   Pulmonary/Chest: No stridor. No respiratory distress.  Abdominal: She exhibits no distension.  Neurological: She is alert.  Skin:  Well healing sacral wound Developing two heel decubitus ulcers, stage 1  Nursing note and vitals reviewed.   ED Course  Procedures (including critical care time) Labs Review Labs Reviewed - No data to display  Imaging Review No results found. I have personally reviewed and evaluated these images and lab results as part of my medical decision-making.   EKG Interpretation None      MDM   Final diagnoses:  Pressure ulcer    Has noticed that she has these 2 wounds to her heels and her progressively worsening over the last week or so. It started elevating them  and they have not progressed since that time. Patient without fevers, altered mental status, nausea or any change in other physical exam parameters.   Marily Memos, MD 03/10/15 [phone]

## 2015-02-26 ENCOUNTER — Telehealth: Payer: Self-pay

## 2015-02-26 NOTE — Telephone Encounter (Signed)
[facility] For 3 no-shows. Melvyn Novasarmen Karly Pitter, MD

## 2015-02-26 NOTE — Telephone Encounter (Signed)
Pt has no showed 3 appts within [facility] in the past year.  -03/26/2014 no show with Butch PennyMegan Millikan, NP  -08/14/2014 no show with Melvyn Novasarmen Dohmeier, MD  -02/05/2015 no show with Melvyn Novasarmen Dohmeier, MD  Would you like to dismiss?

## 2015-03-02 ENCOUNTER — Encounter: Payer: Self-pay | Admitting: Neurology

## 2015-03-09 ENCOUNTER — Inpatient Hospital Stay (HOSPITAL_COMMUNITY)
Admission: EM | Admit: 2015-03-09 | Discharge: 2015-03-11 | DRG: 683 | Disposition: A | Discharge status: Home Health | Payer: Medicare HMO | Attending: Internal Medicine | Admitting: Internal Medicine

## 2015-03-09 ENCOUNTER — Encounter (HOSPITAL_COMMUNITY): Payer: Self-pay

## 2015-03-09 ENCOUNTER — Inpatient Hospital Stay (HOSPITAL_COMMUNITY): Payer: Medicare HMO

## 2015-03-09 DIAGNOSIS — G309 Alzheimer's disease, unspecified: Secondary | ICD-10-CM | POA: Diagnosis present

## 2015-03-09 DIAGNOSIS — L309 Dermatitis, unspecified: Secondary | ICD-10-CM | POA: Diagnosis present

## 2015-03-09 DIAGNOSIS — E870 Hyperosmolality and hypernatremia: Secondary | ICD-10-CM | POA: Diagnosis present

## 2015-03-09 DIAGNOSIS — F329 Major depressive disorder, single episode, unspecified: Secondary | ICD-10-CM | POA: Diagnosis present

## 2015-03-09 DIAGNOSIS — E785 Hyperlipidemia, unspecified: Secondary | ICD-10-CM | POA: Diagnosis present

## 2015-03-09 DIAGNOSIS — L89610 Pressure ulcer of right heel, unstageable: Secondary | ICD-10-CM | POA: Diagnosis present

## 2015-03-09 DIAGNOSIS — L89152 Pressure ulcer of sacral region, stage 2: Secondary | ICD-10-CM | POA: Diagnosis present

## 2015-03-09 DIAGNOSIS — E119 Type 2 diabetes mellitus without complications: Secondary | ICD-10-CM

## 2015-03-09 DIAGNOSIS — Z7401 Bed confinement status: Secondary | ICD-10-CM

## 2015-03-09 DIAGNOSIS — Z79899 Other long term (current) drug therapy: Secondary | ICD-10-CM | POA: Diagnosis not present

## 2015-03-09 DIAGNOSIS — N178 Other acute kidney failure: Secondary | ICD-10-CM | POA: Diagnosis not present

## 2015-03-09 DIAGNOSIS — I129 Hypertensive chronic kidney disease with stage 1 through stage 4 chronic kidney disease, or unspecified chronic kidney disease: Secondary | ICD-10-CM | POA: Diagnosis present

## 2015-03-09 DIAGNOSIS — L89620 Pressure ulcer of left heel, unstageable: Secondary | ICD-10-CM | POA: Diagnosis present

## 2015-03-09 DIAGNOSIS — E1122 Type 2 diabetes mellitus with diabetic chronic kidney disease: Secondary | ICD-10-CM | POA: Diagnosis present

## 2015-03-09 DIAGNOSIS — G301 Alzheimer's disease with late onset: Secondary | ICD-10-CM

## 2015-03-09 DIAGNOSIS — E872 Acidosis: Secondary | ICD-10-CM | POA: Diagnosis present

## 2015-03-09 DIAGNOSIS — Z7982 Long term (current) use of aspirin: Secondary | ICD-10-CM | POA: Diagnosis not present

## 2015-03-09 DIAGNOSIS — D72829 Elevated white blood cell count, unspecified: Secondary | ICD-10-CM | POA: Diagnosis not present

## 2015-03-09 DIAGNOSIS — E860 Dehydration: Secondary | ICD-10-CM | POA: Diagnosis present

## 2015-03-09 DIAGNOSIS — F0280 Dementia in other diseases classified elsewhere without behavioral disturbance: Secondary | ICD-10-CM | POA: Diagnosis present

## 2015-03-09 DIAGNOSIS — E039 Hypothyroidism, unspecified: Secondary | ICD-10-CM | POA: Diagnosis present

## 2015-03-09 DIAGNOSIS — K219 Gastro-esophageal reflux disease without esophagitis: Secondary | ICD-10-CM | POA: Diagnosis present

## 2015-03-09 DIAGNOSIS — N179 Acute kidney failure, unspecified: Secondary | ICD-10-CM | POA: Diagnosis present

## 2015-03-09 DIAGNOSIS — L8990 Pressure ulcer of unspecified site, unspecified stage: Secondary | ICD-10-CM | POA: Insufficient documentation

## 2015-03-09 DIAGNOSIS — N189 Chronic kidney disease, unspecified: Secondary | ICD-10-CM | POA: Diagnosis present

## 2015-03-09 LAB — BASIC METABOLIC PANEL
Anion gap: 12 (ref 5–15)
BUN: 101 mg/dL — ABNORMAL HIGH (ref 6–20)
CALCIUM: 9.7 mg/dL (ref 8.9–10.3)
CHLORIDE: 112 mmol/L — AB (ref 101–111)
CO2: 25 mmol/L (ref 22–32)
CREATININE: 2.99 mg/dL — AB (ref 0.44–1.00)
GFR calc Af Amer: 18 mL/min — ABNORMAL LOW (ref >60)
GFR calc non Af Amer: 15 mL/min — ABNORMAL LOW (ref >60)
GLUCOSE: 130 mg/dL — AB (ref 65–99)
Potassium: 5.5 mmol/L — ABNORMAL HIGH (ref 3.5–5.1)
Sodium: 149 mmol/L — ABNORMAL HIGH (ref 135–145)

## 2015-03-09 LAB — CBC WITH DIFFERENTIAL/PLATELET
BASOS PCT: 0 %
Basophils Absolute: 0 10*3/uL (ref 0.0–0.1)
EOS ABS: 0.1 10*3/uL (ref 0.0–0.7)
EOS PCT: 1 %
HCT: 32.6 % — ABNORMAL LOW (ref 36.0–46.0)
Hemoglobin: 10.4 g/dL — ABNORMAL LOW (ref 12.0–15.0)
Lymphocytes Relative: 17 %
Lymphs Abs: 2 10*3/uL (ref 0.7–4.0)
MCH: 30 pg (ref 26.0–34.0)
MCHC: 31.9 g/dL (ref 30.0–36.0)
MCV: 93.9 fL (ref 78.0–100.0)
MONO ABS: 0.4 10*3/uL (ref 0.1–1.0)
MONOS PCT: 4 %
NEUTROS PCT: 78 %
Neutro Abs: 8.9 10*3/uL — ABNORMAL HIGH (ref 1.7–7.7)
PLATELETS: 299 10*3/uL (ref 150–400)
RBC: 3.47 MIL/uL — ABNORMAL LOW (ref 3.87–5.11)
RDW: 13.8 % (ref 11.5–15.5)
WBC: 11.4 10*3/uL — ABNORMAL HIGH (ref 4.0–10.5)

## 2015-03-09 LAB — PHOSPHORUS: Phosphorus: 4.4 mg/dL (ref 2.5–4.6)

## 2015-03-09 MED ORDER — SODIUM CHLORIDE 0.9 % IV SOLN
Freq: Once | INTRAVENOUS | Status: AC
  Administered 2015-03-09: 15:00:00 via INTRAVENOUS

## 2015-03-09 MED ORDER — DONEPEZIL HCL 10 MG PO TABS
10.0000 mg | ORAL_TABLET | Freq: Every day | ORAL | Status: DC
  Administered 2015-03-09 – 2015-03-10 (×2): 10 mg via ORAL
  Filled 2015-03-09 (×4): qty 1

## 2015-03-09 MED ORDER — HEPARIN SODIUM (PORCINE) 5000 UNIT/ML IJ SOLN
5000.0000 [IU] | Freq: Three times a day (TID) | INTRAMUSCULAR | Status: DC
  Administered 2015-03-09 – 2015-03-11 (×5): 5000 [IU] via SUBCUTANEOUS
  Filled 2015-03-09 (×10): qty 1

## 2015-03-09 MED ORDER — OMEGA-3-ACID ETHYL ESTERS 1 G PO CAPS
1.0000 g | ORAL_CAPSULE | Freq: Every day | ORAL | Status: DC
  Administered 2015-03-10 – 2015-03-11 (×2): 1 g via ORAL
  Filled 2015-03-09 (×2): qty 1

## 2015-03-09 MED ORDER — SENNOSIDES-DOCUSATE SODIUM 8.6-50 MG PO TABS: 1.0000 | ORAL_TABLET | Freq: Every day | ORAL | Status: DC | PRN

## 2015-03-09 MED ORDER — ATORVASTATIN CALCIUM 20 MG PO TABS
20.0000 mg | ORAL_TABLET | Freq: Every day | ORAL | Status: DC
  Administered 2015-03-10 – 2015-03-11 (×2): 20 mg via ORAL
  Filled 2015-03-09 (×2): qty 1

## 2015-03-09 MED ORDER — ASPIRIN EC 325 MG PO TBEC
325.0000 mg | DELAYED_RELEASE_TABLET | Freq: Every day | ORAL | Status: DC
  Administered 2015-03-10 – 2015-03-11 (×2): 325 mg via ORAL
  Filled 2015-03-09 (×2): qty 1

## 2015-03-09 MED ORDER — VENLAFAXINE HCL 75 MG PO TABS
75.0000 mg | ORAL_TABLET | Freq: Every day | ORAL | Status: DC
  Administered 2015-03-10 – 2015-03-11 (×2): 75 mg via ORAL
  Filled 2015-03-09 (×2): qty 1

## 2015-03-09 MED ORDER — SODIUM CHLORIDE 0.9 % IV SOLN
INTRAVENOUS | Status: DC
  Administered 2015-03-09 – 2015-03-10 (×2): via INTRAVENOUS

## 2015-03-09 MED ORDER — CLONAZEPAM 0.5 MG PO TABS
0.5000 mg | ORAL_TABLET | Freq: Every evening | ORAL | Status: DC | PRN
  Administered 2015-03-09: 0.5 mg via ORAL
  Filled 2015-03-09: qty 1

## 2015-03-09 MED ORDER — SODIUM POLYSTYRENE SULFONATE 15 GM/60ML PO SUSP
15.0000 g | Freq: Once | ORAL | Status: AC
  Administered 2015-03-09: 15 g via ORAL
  Filled 2015-03-09: qty 60

## 2015-03-09 NOTE — Progress Notes (Signed)
Patient has medicines from home.  They need to be locked up when she gets to the floor

## 2015-03-09 NOTE — ED Notes (Signed)
Bed: NG29WA04 Expected date:  Expected time:  Means of arrival:  Comments: EMS- 69yo F, pressure ulcers/dementia

## 2015-03-09 NOTE — ED Provider Notes (Signed)
CSN: [id number]     Arrival date & time 03/09/15  1136 History   First MD Initiated Contact with Patient 03/09/15 1145     Chief Complaint  Patient presents with  . Pressure Ulcer      The history is provided by the patient, a relative and a caregiver. No language interpreter was used.   [name] [name] is a 69 y.o. female who presents to the Emergency Department complaining of wound check. She has a gluteal pressure ulcer that has been there for the last several weeks. Family brings her in for check today while her husband is at dialysis. Her caretaker states that she gets one checks and dressing changes twice weekly by home health. She is due to have a check at 3:00 today. Her caretaker states that the wound looks much better than previously. No reports of fever. They do report a slightly decreased appetite. History is provided by caretaker and family as patient has dementia and is a poor historian.   Past Medical History  Diagnosis Date  . 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 Chronic kidney disease   . Alzheimer disease   . DM (diabetes mellitus) (HCC)   . HLD (hyperlipidemia)   . Memory deficits   . Psychosis   . Major depression (HCC)   . Hypertension   . Migraine   . GERD (gastroesophageal reflux disease)   . Eczema   . Hypothyroidism   . Rhinitis   . Arthritis   . Dementia of the Alzheimer's type, with late onset, with depressive mood 05/01/2013  . 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 Heart murmur    Past Surgical History  Procedure Laterality Date  . Abdominal hysterectomy    . Stomach surgery      ?  Marland Kitchen Thyroid surgery    . Dental surgery     Family History  Problem Relation Age of Onset  . Diabetes Mother   . Diabetes Brother   . Diabetes Brother   . Diabetes Sister   . Diabetes Sister   . Diabetes Sister   . Diabetes Sister   . Diabetes Sister   . Diabetes Sister    Social History  Substance Use Topics  .  Smoking status: Never Smoker   . Smokeless tobacco: Never Used  . Alcohol Use: No   OB History    No data available     Review of Systems  Unable to perform ROS: Dementia      Allergies  Benazepril-hydrochlorothiazide  Home Medications   Prior to Admission medications   Medication Sig Start Date End Date Taking? Authorizing Provider  aspirin 81 MG tablet Take 81 mg by mouth daily.      Historical Provider, MD  atorvastatin (LIPITOR) 20 MG tablet Take 20 mg by mouth daily.      Historical Provider, MD  cephALEXin (KEFLEX) 500 MG capsule Take 1 capsule (500 mg total) by mouth 4 (four) times daily. 10/14/14   Mancel Bale, MD  clonazePAM (KLONOPIN) 0.5 MG tablet Take 0.5 mg by mouth daily as needed for anxiety.    Historical Provider, MD  memantine (NAMENDA XR) 28 MG CP24 24 hr capsule Take 1 capsule (28 mg total) by mouth daily. 01/08/15   Porfirio Mylar Dohmeier, MD  omega-3 acid ethyl esters (LOVAZA) 1 G capsule Take 1 g by mouth daily.    Historical Provider, MD  venlafaxine (EFFEXOR) 75 MG tablet Take 75 mg by mouth  2 (two) times daily.    Historical Provider, MD   BP 118/75 mmHg  Pulse 75  Temp(Src) 97.8 F (36.6 C) (Oral)  SpO2 97% Physical Exam  Constitutional: She appears well-developed and well-nourished.  HENT:  Head: Normocephalic and atraumatic.  Cardiovascular: Normal rate and regular rhythm.   No murmur heard. Pulmonary/Chest: Effort normal and breath sounds normal. No respiratory distress.  Abdominal: Soft. There is no tenderness. There is no rebound and no guarding.  Genitourinary:  Skin breakdown to the gluteal cleft with no surrounding edema, no exudate.  Musculoskeletal: She exhibits no edema or tenderness.  Neurological: She is alert.  Pleasantly confused  Skin: Skin is warm and dry.  Psychiatric: She has a normal mood and affect. Her behavior is normal.  Nursing note and vitals reviewed.   ED Course  Procedures (including critical care time) Labs  Review Labs Reviewed  BASIC METABOLIC PANEL - Abnormal; Notable for the following:    Sodium 149 (*)    Potassium 5.5 (*)    Chloride 112 (*)    Glucose, Bld 130 (*)    BUN 101 (*)    Creatinine, Ser 2.99 (*)    GFR calc non Af Amer 15 (*)    GFR calc Af Amer 18 (*)    All other components within normal limits  CBC WITH DIFFERENTIAL/PLATELET - Abnormal; Notable for the following:    WBC 11.4 (*)    RBC 3.47 (*)    Hemoglobin 10.4 (*)    HCT 32.6 (*)    Neutro Abs 8.9 (*)    All other components within normal limits  URINE CULTURE  URINALYSIS, ROUTINE W REFLEX MICROSCOPIC (NOT AT [facility])  URINALYSIS, ROUTINE W REFLEX MICROSCOPIC (NOT AT [facility])    Imaging Review No results found. I have personally reviewed and evaluated these images and lab results as part of my medical decision-making.   EKG Interpretation None      MDM   Final diagnoses:  Acute renal failure, unspecified acute renal failure type [facility](HCC)   Patient with chronic deconditioning and dementia, bed bound at baseline here for wound check and evaluation for decreased oral intake. BMP demonstrates acute on chronic renal insufficiency. Wound does not appear to be infected but needs some local wound care. Plan to admit for IV fluid hydration given her acute kidney injury and further evaluation.   Tilden FossaElizabeth Kamarius Buckbee, MD 03/09/15 [phone]

## 2015-03-09 NOTE — H&P (Signed)
[facility] History and Physical  [name] B Pearcy WUJ:[id number] DOB: 01-Jun-1945 DOA: 03/09/2015  Referring physician: Dr. Madilyn Hookees PCP: Dorrene GermanAVBUERE,EDWIN A, MD   Chief Complaint: dehydration  HPI: [name] [name] is a 69 y.o. female  With history of Alzheimer's dementia, hypertension, major depression. Who presented to the hospital after patient's husband suspected patient to be dehydrated. Most of the history is obtained from caregiver at bedside, EMR, ED physician. Reportedly the patient has not been eating well for the last several days.   Review of Systems:  Unable to accurately assess due to dementia  Past Medical History  Diagnosis Date  . 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 Chronic kidney disease   . Alzheimer disease   . DM (diabetes mellitus) (HCC)   . HLD (hyperlipidemia)   . Memory deficits   . Psychosis   . Major depression (HCC)   . Hypertension   . Migraine   . GERD (gastroesophageal reflux disease)   . Eczema   . Hypothyroidism   . Rhinitis   . Arthritis   . Dementia of the Alzheimer's type, with late onset, with depressive mood 05/01/2013  . 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 Heart murmur    Past Surgical History  Procedure Laterality Date  . Abdominal hysterectomy    . Stomach surgery      ?  Marland Kitchen. Thyroid surgery    . Dental surgery     Social History:  reports that she has never smoked. She has never used smokeless tobacco. She reports that she does not drink alcohol or use illicit drugs.  Allergies  Allergen Reactions  . Benazepril-Hydrochlorothiazide Other (See Comments)    unknown    Family History  Problem Relation Age of Onset  . Diabetes Mother   . Diabetes Brother   . Diabetes Brother   . Diabetes Sister   . Diabetes Sister   . Diabetes Sister   . Diabetes Sister   . Diabetes Sister   . Diabetes Sister     Prior to Admission medications   Medication Sig Start Date End Date Taking?  Authorizing Provider  aspirin EC 325 MG tablet Take 325 mg by mouth daily.   Yes Historical Provider, MD  atorvastatin (LIPITOR) 20 MG tablet Take 20 mg by mouth daily.     Yes Historical Provider, MD  clonazePAM (KLONOPIN) 0.5 MG tablet Take 0.5 mg by mouth at bedtime as needed for anxiety.    Yes Historical Provider, MD  donepezil (ARICEPT) 10 MG tablet Take 10 mg by mouth at bedtime.   Yes Historical Provider, MD  furosemide (LASIX) 20 MG tablet Take 20 mg by mouth daily as needed (leg swelling).   Yes Historical Provider, MD  lisinopril (PRINIVIL,ZESTRIL) 10 MG tablet Take 10 mg by mouth daily.   Yes Historical Provider, MD  omega-3 acid ethyl esters (LOVAZA) 1 G capsule Take 1 g by mouth daily.   Yes Historical Provider, MD  sennosides-docusate sodium (SENOKOT-S) 8.6-50 MG tablet Take 1 tablet by mouth as needed for constipation.   Yes Historical Provider, MD  venlafaxine (EFFEXOR) 75 MG tablet Take 75 mg by mouth daily.    Yes Historical Provider, MD  cephALEXin (KEFLEX) 500 MG capsule Take 1 capsule (500 mg total) by mouth 4 (four) times daily. Patient not taking: Reported on 03/09/2015 10/14/14   Mancel BaleElliott Wentz, MD   Physical Exam: Filed Vitals:   03/09/15 1149 03/09/15 1356 03/09/15 1450 03/09/15 1633  BP: 118/75 122/74 120/67 140/61  Pulse: 75 68 74 74  Temp: 97.8 F (36.6 C)     TempSrc: Oral     Resp:  SpO2: 97% 99% 94% 95%    Wt Readings from Last 3 Encounters:  10/14/14 74.844 kg (165 lb)  04/09/14 77.066 kg (169 lb 14.4 oz)  01/09/14 74.435 kg (164 lb 1.6 oz)    General:  Appears calm and comfortable Eyes: PERRL, normal lids, irises & conjunctiva ENT: grossly normal hearing, lips & tongue, dry mucous membranes Neck: no LAD, masses or thyromegaly Cardiovascular: RRR, positive systolic murmur.  Respiratory: CTA bilaterally, no w/r/r. Normal respiratory effort. Abdomen: soft, nt, nd Skin: no rash or induration seen on limited exam Musculoskeletal: grossly  normal tone BUE/BLE Psychiatric: Unable to accurately assess secondary to dementia Neurologic: No facial asymmetry moves extremities equally           Labs on Admission:  Basic Metabolic Panel:  Recent Labs Lab 03/09/15 1241  NA 149*  K 5.5*  CL 112*  CO2 25  GLUCOSE 130*  BUN 101*  CREATININE 2.99*  CALCIUM 9.7   Liver Function Tests: No results for input(s): AST, ALT, ALKPHOS, BILITOT, PROT, ALBUMIN in the last 168 hours. No results for input(s): LIPASE, AMYLASE in the last 168 hours. No results for input(s): AMMONIA in the last 168 hours. CBC:  Recent Labs Lab 03/09/15 1241  WBC 11.4*  NEUTROABS 8.9*  HGB 10.4*  HCT 32.6*  MCV 93.9  PLT 299   Cardiac Enzymes: No results for input(s): CKTOTAL, CKMB, CKMBINDEX, TROPONINI in the last 168 hours.  BNP (last 3 results) No results for input(s): BNP in the last 8760 hours.  ProBNP (last 3 results) No results for input(s): PROBNP in the last 8760 hours.  CBG: No results for input(s): GLUCAP in the last 168 hours.  Radiological Exams on Admission: No results found.   Assessment/Plan Principal Problem:   ARF (acute renal failure) (HCC) - We'll hydrate and reassess serum creatinine next a.m. - Hold nephrotoxic agent lisinopril  Active Problems:   Dementia of the Alzheimer's type, with late onset, with depressive mood - Most likely contributing to poor oral intake   Code Status: full DVT Prophylaxis: heparin Family Communication: Discussed with caregiver at bedside Disposition Plan: Pending improvement in condition  Time spent: More than 45 minutes  Penny Pia [facility] Pager [phone]

## 2015-03-09 NOTE — Progress Notes (Signed)
Patient arrived on unit, room 1505.  Vitals obtained.  Patient assessed.  Placed on telemetry monitor 63 and central telemetry notified.  Patient crying and disoriented.  Mepilex foam placed on sacrum over pressure ulcer.  Family arrived at bedside and patient is now calm.  Patient assessed, will continue to monitor.

## 2015-03-09 NOTE — ED Notes (Signed)
Pt presents from home via EMS with c/o pressure ulcer on her buttocks. Pt reports [facility] and was told the ulcers were improving but are now getting worse. Husband wanted the pt to have the ulcers looked at by a doctor. No other complaints per pt and EMS.

## 2015-03-09 NOTE — ED Notes (Signed)
Admitting nurse at bedside along with family and aid.

## 2015-03-09 NOTE — ED Notes (Signed)
Unable to collect urine during in and out, little discharge only info nurse

## 2015-03-10 DIAGNOSIS — L8990 Pressure ulcer of unspecified site, unspecified stage: Secondary | ICD-10-CM | POA: Insufficient documentation

## 2015-03-10 LAB — CBC
HCT: 32.2 % — ABNORMAL LOW (ref 36.0–46.0)
Hemoglobin: 10.4 g/dL — ABNORMAL LOW (ref 12.0–15.0)
MCH: 30.2 pg (ref 26.0–34.0)
MCHC: 32.3 g/dL (ref 30.0–36.0)
MCV: 93.6 fL (ref 78.0–100.0)
Platelets: 293 10*3/uL (ref 150–400)
RBC: 3.44 MIL/uL — ABNORMAL LOW (ref 3.87–5.11)
RDW: 13.6 % (ref 11.5–15.5)
WBC: 9.6 10*3/uL (ref 4.0–10.5)

## 2015-03-10 LAB — BASIC METABOLIC PANEL
Anion gap: 11 (ref 5–15)
Anion gap: 11 (ref 5–15)
BUN: 78 mg/dL — AB (ref 6–20)
BUN: 52 mg/dL — ABNORMAL HIGH (ref 6–20)
CALCIUM: 9.1 mg/dL (ref 8.9–10.3)
CALCIUM: 8.6 mg/dL — AB (ref 8.9–10.3)
CO2: 25 mmol/L (ref 22–32)
CO2: 21 mmol/L — ABNORMAL LOW (ref 22–32)
CREATININE: 1.55 mg/dL — AB (ref 0.44–1.00)
Chloride: 116 mmol/L — ABNORMAL HIGH (ref 101–111)
Chloride: 112 mmol/L — ABNORMAL HIGH (ref 101–111)
Creatinine, Ser: 1.98 mg/dL — ABNORMAL HIGH (ref 0.44–1.00)
GFR calc Af Amer: 29 mL/min — ABNORMAL LOW (ref >60)
GFR calc non Af Amer: 34 mL/min — ABNORMAL LOW (ref >60)
GFR, EST AFRICAN AMERICAN: 39 mL/min — AB (ref >60)
GFR, EST NON AFRICAN AMERICAN: 25 mL/min — AB (ref >60)
GLUCOSE: 115 mg/dL — AB (ref 65–99)
Glucose, Bld: 166 mg/dL — ABNORMAL HIGH (ref 65–99)
Potassium: 4.3 mmol/L (ref 3.5–5.1)
Potassium: 3.9 mmol/L (ref 3.5–5.1)
SODIUM: 144 mmol/L (ref 135–145)
Sodium: 152 mmol/L — ABNORMAL HIGH (ref 135–145)

## 2015-03-10 MED ORDER — DEXTROSE 5 % IV SOLN
INTRAVENOUS | Status: DC
  Administered 2015-03-10 (×2): via INTRAVENOUS

## 2015-03-10 MED ORDER — COLLAGENASE 250 UNIT/GM EX OINT
TOPICAL_OINTMENT | Freq: Every day | CUTANEOUS | Status: DC
  Administered 2015-03-10 – 2015-03-11 (×2): via TOPICAL
  Filled 2015-03-10: qty 30

## 2015-03-10 NOTE — Care Management Note (Signed)
Case Management Note  Patient Details  Name: [name] [name] MRN: [id number] Date of Birth: 04/19/1945  Subjective/Objective:          aki and decubitus to both heels and sacrum          Action/Plan:Date: March 10, 2015 Chart reviewed for concurrent status and case management needs. Will continue to follow patient for changes and needs: [name] Smilinghonda Davis, RN, BSN, [place]   [phone]   Expected Discharge Date:   (unknown)               Expected Discharge Plan:  Home/Self Care  In-House Referral:  NA  Discharge planning Services  CM Consult  Post Acute Care Choice:  NA Choice offered to:  NA  DME Arranged:  N/A DME Agency:  NA  HH Arranged:  NA HH Agency:  NA  Status of Service:  In process, will continue to follow  Medicare Important Message Given:    Date Medicare IM Given:    Medicare IM give by:    Date Additional Medicare IM Given:    Additional Medicare Important Message give by:     If discussed at Long Length of Stay Meetings, dates discussed:    Additional Comments:  Golda AcreDavis, Rhonda Lynn, RN 03/10/2015, 2:19 PM

## 2015-03-10 NOTE — Plan of Care (Signed)
Problem: Nutrition: Goal: Adequate nutrition will be maintained Outcome: Progressing Encouraging good po intake.

## 2015-03-10 NOTE — Consult Note (Signed)
WOC wound consult note Reason for Consult: bilateral heel Unstageable pressure injuries.  Unstageable PrI at the sacral/coccyx area. Wound type:pressure Pressure Ulcer POA: Yes Measurement: Right heel:  3cm x 4cm stable black eshcar.  Left heel:  4cm x 3.5cm stable black eschar.  Sacral/coccyx area:  7cm x 3cm area of involvement with 3cm x 3cm area of soft, grey eschar over bony prominence indicative of deeper tissue injury.   Wound bed:As described above. Drainage (amount, consistency, odor) No drainage from any of the three above mentioned ulcers. Periwound:intact, dry Dressing procedure/placement/frequency: I will today provide a therapeutic mattress replacement with low air loss feature.  Additionally, bilateral pressure redistribution heel boots are provided.  WOund care fo rthe bilateral heels will be aimed at keeping stable eschar intact:  We will paint daily with a betadine swabstick and dress with dry dressings, placing feet into pressure redistribution boots.  The sacral Unstageable pressure injury is grave, the soft eschar over the bony prominence is indicative of deeper tissue injury.  I will begin therapy with an enzymatic debriding agent to slowly dissolve the necrotic tissue until the true depth is determined.  This will take weeks to months. Two female family members and the patient's bedside RN are in the room at the time of my visit and one woman comes to bedside to see the depth of tissue injury.  I explain my findings, teach that patient will have to stay off of her back at all times turning from side to side.  The Prevalon boots are explained and the family is aware that the patient will need to continue use of these at home. I teach all that I have provided an order for a therapeutic mattress while here. All express appreciation for the visit and interventions. Nutritional consult has been placed previously. WOC nursing team will not follow, but will remain available to this patient,  the nursing and medical teams.  Please re-consult if needed. Thanks, Ladona MowLaurie Cleve Paolillo, MSN, RN, GNP, Hans EdenCWOCN, CWON-AP, FAAN  Pager# [phone]

## 2015-03-10 NOTE — Progress Notes (Signed)
[facility] PROGRESS NOTE  [name] [name] IEP:[id number] DOB: 12-Nov-1945 DOA: 03/09/2015 PCP: Dorrene German, MD  Brief narrative: Patient is a 69 year old with history of Alzheimer's dementia who presented to the hospital with dehydration and acute renal failure.  Assessment/Plan: Principal Problem:   ARF (acute renal failure) (HCC) - Will place on IVF's and reassess next am.  Hypernatremia - most likely due to dehydration. - will hydrate and reassess  Active Problems:   Dementia of the Alzheimer's type, with late onset, with depressive mood -Stable continue Aricept    Pressure ulcer  Code Status: full Family Communication: Discussed with caregiver Disposition Plan: Pending improvement in condition   Consultants:  None  Procedures:  None  Antibiotics:  None  HPI/Subjective: Patient has no new complaints. No acute issues overnight.  Objective: Filed Vitals:   03/10/15 0639 03/10/15 1500  BP: 147/67 121/67  Pulse: 80 71  Temp: 97.4 F (36.3 C) 98.4 F (36.9 C)  Resp: 18 18    Intake/Output Summary (Last 24 hours) at 03/10/15 1726 Last data filed at 03/10/15 1600  Gross per 24 hour  Intake 2758.33 ml  Output      0 ml  Net 2758.33 ml   There were no vitals filed for this visit.  Exam:   General:  Patient in no acute distress, alert and awake, pleasantly confused  Cardiovascular: S1 and S2 within normal limits, no rubs  Respiratory: No increased work of breathing, no wheezes, equal chest rise  Abdomen: Soft, nondistended, nontender  Musculoskeletal: No cyanosis or clubbing on limited exam   Data Reviewed: Basic Metabolic Panel:  Recent Labs Lab 03/09/15 1241 03/09/15 1920 03/10/15 0520  NA 149*  --  152*  K 5.5*  --  4.3  CL 112*  --  116*  CO2 25  --  25  GLUCOSE 130*  --  115*  BUN 101*  --  78*  CREATININE 2.99*  --  1.98*  CALCIUM 9.7  --  9.1  PHOS  --  4.4  --    Liver Function Tests: No results for input(s):  AST, ALT, ALKPHOS, BILITOT, PROT, ALBUMIN in the last 168 hours. No results for input(s): LIPASE, AMYLASE in the last 168 hours. No results for input(s): AMMONIA in the last 168 hours. CBC:  Recent Labs Lab 03/09/15 1241 03/10/15 0520  WBC 11.4* 9.6  NEUTROABS 8.9*  --   HGB 10.4* 10.4*  HCT 32.6* 32.2*  MCV 93.9 93.6  PLT 299 293   Cardiac Enzymes: No results for input(s): CKTOTAL, CKMB, CKMBINDEX, TROPONINI in the last 168 hours. BNP (last 3 results) No results for input(s): BNP in the last 8760 hours.  ProBNP (last 3 results) No results for input(s): PROBNP in the last 8760 hours.  CBG: No results for input(s): GLUCAP in the last 168 hours.  No results found for this or any previous visit (from the past 240 hour(s)).   Studies: Dg Chest Port 1 View  03/09/2015  CLINICAL DATA:  69 year old female with dementia and confusion. Leukocytosis. EXAM: PORTABLE CHEST 1 VIEW COMPARISON:  01/11/2015 and prior chest radiographs FINDINGS: The cardiomediastinal silhouette is unremarkable. There is no evidence of focal airspace disease, pulmonary edema, suspicious pulmonary nodule/mass, pleural effusion, or pneumothorax. No acute bony abnormalities are identified. IMPRESSION: No active disease. Electronically Signed   By: Harmon Pier M.D.   On: 03/09/2015 19:17    Scheduled Meds: . aspirin EC  325 mg Oral Daily  . atorvastatin  20 mg  Oral Daily  . collagenase   Topical Daily  . donepezil  10 mg Oral QHS  . heparin  5,000 Units Subcutaneous 3 times per day  . omega-3 acid ethyl esters  1 g Oral Daily  . venlafaxine  75 mg Oral Daily   Continuous Infusions: . dextrose 100 mL/hr at 03/10/15 1600    Time spent: > 35 minutes    [name] [name], [name] [name]  [facility] Pager [phone] If 7PM-7AM, please contact night-coverage at [email or web address], password [email or web address] 03/10/2015, 5:26 PM  LOS: 1 day

## 2015-03-11 DIAGNOSIS — G309 Alzheimer's disease, unspecified: Secondary | ICD-10-CM

## 2015-03-11 DIAGNOSIS — D72829 Elevated white blood cell count, unspecified: Secondary | ICD-10-CM

## 2015-03-11 DIAGNOSIS — F0280 Dementia in other diseases classified elsewhere without behavioral disturbance: Secondary | ICD-10-CM

## 2015-03-11 DIAGNOSIS — N178 Other acute kidney failure: Secondary | ICD-10-CM

## 2015-03-11 LAB — BASIC METABOLIC PANEL
Anion gap: 9 (ref 5–15)
BUN: 36 mg/dL — AB (ref 6–20)
CO2: 23 mmol/L (ref 22–32)
Calcium: 8.7 mg/dL — ABNORMAL LOW (ref 8.9–10.3)
Chloride: 108 mmol/L (ref 101–111)
Creatinine, Ser: 1.33 mg/dL — ABNORMAL HIGH (ref 0.44–1.00)
GFR, EST AFRICAN AMERICAN: 47 mL/min — AB (ref >60)
GFR, EST NON AFRICAN AMERICAN: 40 mL/min — AB (ref >60)
Glucose, Bld: 143 mg/dL — ABNORMAL HIGH (ref 65–99)
POTASSIUM: 3.6 mmol/L (ref 3.5–5.1)
SODIUM: 140 mmol/L (ref 135–145)

## 2015-03-11 MED ORDER — COLLAGENASE 250 UNIT/GM EX OINT: TOPICAL_OINTMENT | Freq: Every day | CUTANEOUS | Status: DC

## 2015-03-11 NOTE — Discharge Instructions (Signed)
Acute Kidney Injury °Acute kidney injury is any condition in which there is sudden (acute) damage to the kidneys. Acute kidney injury was previously known as acute kidney failure or acute renal failure. The kidneys are two organs that lie on either side of the spine between the middle of the back and the front of the abdomen. The kidneys: °· Remove wastes and extra water from the blood.   °· Produce important hormones. These help keep bones strong, regulate blood pressure, and help create red blood cells.   °· Balance the fluids and chemicals in the blood and tissues. °A small amount of kidney damage may not cause problems, but a large amount of damage may make it difficult or impossible for the kidneys to work the way they should. Acute kidney injury may develop into long-lasting (chronic) kidney disease. It may also develop into a life-threatening disease called end-stage kidney disease. Acute kidney injury can get worse very quickly, so it should be treated right away. Early treatment may prevent other kidney diseases from developing. °CAUSES  °· A problem with blood flow to the kidneys. This may be caused by:   °¨ Blood loss.   °¨ Heart disease.   °¨ Severe burns.   °¨ Liver disease. °· Direct damage to the kidneys. This may be caused by: °¨ Some medicines.   °¨ A kidney infection.   °¨ Poisoning or consuming toxic substances.   °¨ A surgical wound.   °¨ A blow to the kidney area.   °· A problem with urine flow. This may be caused by:   °¨ Cancer.   °¨ Kidney stones.   °¨ An enlarged prostate. °SIGNS AND SYMPTOMS  °· Swelling (edema) of the legs, ankles, or feet.   °· Tiredness (lethargy).   °· Nausea or vomiting.   °· Confusion.   °· Problems with urination, such as:   °¨ Painful or burning feeling during urination.   °¨ Decreased urine production.   °¨ Frequent accidents in children who are potty trained.   °¨ Bloody urine.   °· Muscle twitches and cramps.   °· Shortness of breath.   °· Seizures.   °· Chest  pain or pressure. °Sometimes, no symptoms are present.  °DIAGNOSIS °Acute kidney injury may be detected and diagnosed by tests, including blood, urine, imaging, or kidney biopsy tests.  °TREATMENT °Treatment of acute kidney injury varies depending on the cause and severity of the kidney damage. In mild cases, no treatment may be needed. The kidneys may heal on their own. If acute kidney injury is more severe, your health care provider will treat the cause of the kidney damage, help the kidneys heal, and prevent complications from occurring. Severe cases may require a procedure to remove toxic wastes from the body (dialysis) or surgery to repair kidney damage. Surgery may involve:  °· Repair of a torn kidney.   °· Removal of an obstruction. °HOME CARE INSTRUCTIONS °· Follow your prescribed diet. °· Take medicines only as directed by your health care provider.  °· Do not take any new medicines (prescription, over-the-counter, or nutritional supplements) unless approved by your health care provider. Many medicines can worsen your kidney damage or may need to have the dose adjusted.   °· Keep all follow-up visits as directed by your health care provider. This is important. °· Observe your condition to make sure you are healing as expected. °SEEK IMMEDIATE MEDICAL CARE IF: °· You are feeling ill or have severe pain in the back or side.   °· Your symptoms return or you have new symptoms. °· You have any symptoms of end-stage kidney disease. These include:   °¨ Persistent itchiness.   °¨   Loss of appetite.   °¨ Headaches.   °¨ Abnormally dark or light skin. °¨ Numbness in the hands or feet.   °¨ Easy bruising.   °¨ Frequent hiccups.   °¨ Menstruation stops.   °· You have a fever. °· You have increased urine production. °· You have pain or bleeding when urinating. °MAKE SURE YOU:  °· Understand these instructions. °· Will watch your condition. °· Will get help right away if you are not doing well or get worse. °  °This  information is not intended to replace advice given to you by your health care provider. Make sure you discuss any questions you have with your health care provider. °  °Document Released: 10/11/2010 Document Revised: 04/18/2014 Document Reviewed: 11/25/2011 °Elsevier Interactive Patient Education ©2016 [employer] ° °

## 2015-03-11 NOTE — Discharge Summary (Signed)
Physician Discharge Summary  [place] ZOX:[id number] DOB: 1946/01/06 DOA: 03/09/2015  PCP: Dorrene GermanAVBUERE,EDWIN A, MD  Admit date: 03/09/2015 Discharge date: 03/11/2015  Recommendations for Outpatient Follow-up:  1. Pt will need to follow up with PCP in 1-2 weeks post discharge 2. Please obtain BMP to evaluate electrolytes and kidney function 3. Please also check CBC to evaluate Hg and Hct levels 4. Please note that Lisinopril was stopped until renal function stabilizes  5. Lasix was continued but care taker at bedside made aware of importance to follow up with PCP for follow up blood work   Discharge Diagnoses:  Principal Problem:   ARF (acute renal failure) (HCC) Active Problems:   Dementia of the Alzheimer's type, with late onset, with depressive mood   Type II diabetes mellitus (HCC)   Acute renal failure (ARF) (HCC)   Pressure ulcer  Discharge Condition: Stable  Diet recommendation: Heart healthy diet discussed in details   History of present illness:  Patient is a 69 year old with history of Alzheimer's dementia who presented to the hospital with dehydration and acute renal failure.  Assessment/Plan: Principal Problem:  ARF (acute renal failure) (HCC) with metabolic acidosis  - pre renal in etiology - IVF have been provided and Cr trending down  - pt has refused blood work and care taker would like to take pt home - pt has tolerated diet well and care taker made aware of holding Lisinopril until renal function stabilizes  Hypernatremia - most likely due to dehydration. - responded to IVF, resolved   Active Problems:  Dementia of the Alzheimer's type, with late onset, with depressive mood - Stable continue Aricept   Pressure ulcers - Right heel: 3cm x 4cm stable black eshcar - Left heel: 4cm x 3.5cm stable black eschar  - Sacral/coccyx area: 7 x 3 cm area with 3 x 3cm area of grey eschar over bony prominence with deeper tissue injury - continue with  wound care   Code Status: Full  Family Communication: Discussed with caregiver Disposition Plan: Home   Procedures/Studies: Dg Chest Port 1 View  03/09/2015  No active disease.  Discharge Exam: Filed Vitals:   03/10/15 2244 03/11/15 0500  BP: 143/52 140/61  Pulse: 78 79  Temp: 98.6 F (37 C) 98.2 F (36.8 C)  Resp: 16 18   Filed Vitals:   03/10/15 0639 03/10/15 1500 03/10/15 2244 03/11/15 0500  BP: 147/67 121/67 143/52 140/61  Pulse: 80 71 78 79  Temp: 97.4 F (36.3 C) 98.4 F (36.9 C) 98.6 F (37 C) 98.2 F (36.8 C)  TempSrc: Axillary Axillary Axillary Axillary  Resp: 18 18 16 18   SpO2: 95% 100% 100% 100%    General: Pt is alert, follows commands appropriately, not in acute distress Cardiovascular: Regular rate and rhythm, no rubs, no gallops Respiratory: Clear to auscultation bilaterally, no wheezing, no crackles, no rhonchi Abdominal: Soft, non tender, non distended, bowel sounds +, no guarding   Discharge Instructions  Discharge Instructions    Diet - low sodium heart healthy    Complete by:  As directed      Increase activity slowly    Complete by:  As directed             Medication List    STOP taking these medications        cephALEXin 500 MG capsule  Commonly known as:  KEFLEX     lisinopril 10 MG tablet  Commonly known as:  PRINIVIL,ZESTRIL      TAKE  these medications        aspirin EC 325 MG tablet  Take 325 mg by mouth daily.     atorvastatin 20 MG tablet  Commonly known as:  LIPITOR  Take 20 mg by mouth daily.     clonazePAM 0.5 MG tablet  Commonly known as:  KLONOPIN  Take 0.5 mg by mouth at bedtime as needed for anxiety.     collagenase ointment  Commonly known as:  SANTYL  Apply topically daily.     donepezil 10 MG tablet  Commonly known as:  ARICEPT  Take 10 mg by mouth at bedtime.     furosemide 20 MG tablet  Commonly known as:  LASIX  Take 20 mg by mouth daily as needed (leg swelling).     omega-3 acid ethyl  esters 1 G capsule  Commonly known as:  LOVAZA  Take 1 g by mouth daily.     sennosides-docusate sodium 8.6-50 MG tablet  Commonly known as:  SENOKOT-S  Take 1 tablet by mouth as needed for constipation.     venlafaxine 75 MG tablet  Commonly known as:  EFFEXOR  Take 75 mg by mouth daily.            Follow-up Information    Follow up with Dorrene German, MD.   Specialty:  Internal Medicine   Contact information:   [address] [place] [address] [phone]        The results of significant diagnostics from this hospitalization (including imaging, microbiology, ancillary and laboratory) are listed below for reference.     Microbiology: No results found for this or any previous visit (from the past 240 hour(s)).   Labs: Basic Metabolic Panel:  Recent Labs Lab 03/09/15 1241 03/09/15 1920 03/10/15 0520 03/10/15 1930  NA 149*  --  152* 144  K 5.5*  --  4.3 3.9  CL 112*  --  116* 112*  CO2 25  --  25 21*  GLUCOSE 130*  --  115* 166*  BUN 101*  --  78* 52*  CREATININE 2.99*  --  1.98* 1.55*  CALCIUM 9.7  --  9.1 8.6*  PHOS  --  4.4  --   --    CBC:  Recent Labs Lab 03/09/15 1241 03/10/15 0520  WBC 11.4* 9.6  NEUTROABS 8.9*  --   HGB 10.4* 10.4*  HCT 32.6* 32.2*  MCV 93.9 93.6  PLT 299 293   SIGNED: Time coordinating discharge: 30 minutes  Debbora Presto, MD  [facility] 03/11/2015, 9:28 AM Pager [phone]  If 7PM-7AM, please contact night-coverage [email or web address] Password [email or web address]

## 2015-03-11 NOTE — Progress Notes (Signed)
[facility]

## 2015-03-11 NOTE — Progress Notes (Signed)
Initial Nutrition Assessment  DOCUMENTATION CODES:   Not applicable  INTERVENTION:  - Will order nutrition supplements 12/1 if pt unable to d/c today - RD will continue to monitor for needs  NUTRITION DIAGNOSIS:   Increased nutrient needs related to wound healing as evidenced by estimated needs.  GOAL:   Patient will meet greater than or equal to 90% of their needs  MONITOR:   PO intake, Weight trends, Labs, Skin, I & O's  REASON FOR ASSESSMENT:   Malnutrition Screening Tool, Consult Wound healing  ASSESSMENT:   69 year-old female with past medical hx of Alzheimer's dementia, hypertension, major depression. Who presented to the hospital after patient's husband suspected patient to be dehydrated. Most of the history is obtained from caregiver at bedside, EMR, ED physician. Reportedly the patient has not been eating well for the last several days.  Pt seen for MST and consult. BMI and estimated nutrition needs calculated using weight from 10/14/14 as there is no current weight on file. Pt unable to provide information and no family/visitors present to provide information at this time.   Chart review indicates 100% intake of breakfast yesterday (11/29) and today. Visualized breakfast tray on sink in room with only a few bites taken of eggs and grits and toast untouched.   Physical assessment showed no muscle or fat wasting. Unable to determine any recent weight fluctuations or normal intakes for pt although notes indicate pt was not eating well for several days PTA.  D/c order in place but not d/c summary. If pt unable to d/c today will order supplements 12/1.  Medications reviewed. Labs reviewed; BUN/creatinine elevated but trending down, Ca: 8.7 mg/dL, GFR: 47.   Diet Order:  DIET SOFT Room service appropriate?: Yes; Fluid consistency:: Thin Diet - low sodium heart healthy  Skin:  Wound (see comment) (Stage 2 sacral pressure ulcer, Bilateral heel DTIs)  Last BM:   11/30  Height:   Ht Readings from Last 1 Encounters:  10/14/14 5\' 3"  (1.6 m)    Weight:   Wt Readings from Last 1 Encounters:  10/14/14 165 lb (74.844 kg)    Ideal Body Weight:   52.27 kg  BMI:  29.23 kg/m2  Estimated Nutritional Needs:   Kcal:  1650-1850  Protein:  70-80 grams  Fluid:  2.2 L/day  EDUCATION NEEDS:   No education needs identified at this time     Trenton GammonJessica Meckenzie Balsley, RD, LDN Inpatient Clinical Dietitian Pager # [phone] After hours/weekend pager # [phone]

## 2015-03-11 NOTE — Progress Notes (Signed)
CSW was advised by RN that patient needs transportation back to her home. CSW arrange non emergency ambulance ([facility]) to transport the patient back her home. No further CSW needs requested at this time. CSW to sign off.   Fernande BoydenJoyce Kimberlyn Quiocho, [facility]CSWA Clinical Social Worker [facility] [phone]

## 2015-03-11 NOTE — Progress Notes (Signed)
Patient discharged.  IV removed, clean/dry/intact.  Telemetry removed and central telemetry notified. Patient changed into cloth gown.  Patient's caregiver educated on discharge instructions and appointment.  Santyl sent electronically to pharmacy.  Caregiver notified.  Caregiver stated understanding and signed AVS.  Social worker called transportation.  No concerns at this time.

## 2015-03-22 ENCOUNTER — Emergency Department (HOSPITAL_COMMUNITY): Payer: Medicare HMO

## 2015-03-22 ENCOUNTER — Inpatient Hospital Stay (HOSPITAL_COMMUNITY)
Admission: EM | Admit: 2015-03-22 | Discharge: 2015-03-25 | DRG: 853 | Disposition: A | Discharge status: Hospice | Payer: Medicare HMO | Attending: Internal Medicine | Admitting: Internal Medicine

## 2015-03-22 ENCOUNTER — Encounter (HOSPITAL_COMMUNITY): Payer: Self-pay | Admitting: Emergency Medicine

## 2015-03-22 ENCOUNTER — Inpatient Hospital Stay (HOSPITAL_COMMUNITY): Payer: Medicare HMO | Admitting: Anesthesiology

## 2015-03-22 ENCOUNTER — Encounter (HOSPITAL_COMMUNITY)
Admission: EM | Disposition: A | Discharge status: Hospice | Payer: Self-pay | Source: Home / Self Care | Attending: Pulmonary Disease

## 2015-03-22 DIAGNOSIS — Z833 Family history of diabetes mellitus: Secondary | ICD-10-CM | POA: Diagnosis not present

## 2015-03-22 DIAGNOSIS — L89154 Pressure ulcer of sacral region, stage 4: Secondary | ICD-10-CM

## 2015-03-22 DIAGNOSIS — F329 Major depressive disorder, single episode, unspecified: Secondary | ICD-10-CM | POA: Diagnosis present

## 2015-03-22 DIAGNOSIS — K5641 Fecal impaction: Secondary | ICD-10-CM | POA: Diagnosis present

## 2015-03-22 DIAGNOSIS — L0291 Cutaneous abscess, unspecified: Secondary | ICD-10-CM | POA: Insufficient documentation

## 2015-03-22 DIAGNOSIS — D62 Acute posthemorrhagic anemia: Secondary | ICD-10-CM | POA: Diagnosis not present

## 2015-03-22 DIAGNOSIS — Z7982 Long term (current) use of aspirin: Secondary | ICD-10-CM | POA: Diagnosis not present

## 2015-03-22 DIAGNOSIS — Z66 Do not resuscitate: Secondary | ICD-10-CM | POA: Diagnosis present

## 2015-03-22 DIAGNOSIS — L97422 Non-pressure chronic ulcer of left heel and midfoot with fat layer exposed: Secondary | ICD-10-CM | POA: Diagnosis present

## 2015-03-22 DIAGNOSIS — K219 Gastro-esophageal reflux disease without esophagitis: Secondary | ICD-10-CM | POA: Diagnosis present

## 2015-03-22 DIAGNOSIS — E872 Acidosis, unspecified: Secondary | ICD-10-CM

## 2015-03-22 DIAGNOSIS — J310 Chronic rhinitis: Secondary | ICD-10-CM | POA: Diagnosis present

## 2015-03-22 DIAGNOSIS — J9600 Acute respiratory failure, unspecified whether with hypoxia or hypercapnia: Secondary | ICD-10-CM

## 2015-03-22 DIAGNOSIS — E039 Hypothyroidism, unspecified: Secondary | ICD-10-CM | POA: Diagnosis present

## 2015-03-22 DIAGNOSIS — E11621 Type 2 diabetes mellitus with foot ulcer: Secondary | ICD-10-CM | POA: Diagnosis present

## 2015-03-22 DIAGNOSIS — I96 Gangrene, not elsewhere classified: Secondary | ICD-10-CM

## 2015-03-22 DIAGNOSIS — N183 Chronic kidney disease, stage 3 (moderate): Secondary | ICD-10-CM | POA: Diagnosis present

## 2015-03-22 DIAGNOSIS — E1122 Type 2 diabetes mellitus with diabetic chronic kidney disease: Secondary | ICD-10-CM | POA: Diagnosis present

## 2015-03-22 DIAGNOSIS — I129 Hypertensive chronic kidney disease with stage 1 through stage 4 chronic kidney disease, or unspecified chronic kidney disease: Secondary | ICD-10-CM | POA: Diagnosis present

## 2015-03-22 DIAGNOSIS — M1990 Unspecified osteoarthritis, unspecified site: Secondary | ICD-10-CM | POA: Diagnosis present

## 2015-03-22 DIAGNOSIS — G3183 Dementia with Lewy bodies: Secondary | ICD-10-CM | POA: Diagnosis present

## 2015-03-22 DIAGNOSIS — E785 Hyperlipidemia, unspecified: Secondary | ICD-10-CM | POA: Diagnosis present

## 2015-03-22 DIAGNOSIS — Z515 Encounter for palliative care: Secondary | ICD-10-CM | POA: Diagnosis present

## 2015-03-22 DIAGNOSIS — E1169 Type 2 diabetes mellitus with other specified complication: Secondary | ICD-10-CM

## 2015-03-22 DIAGNOSIS — Z79899 Other long term (current) drug therapy: Secondary | ICD-10-CM

## 2015-03-22 DIAGNOSIS — E870 Hyperosmolality and hypernatremia: Secondary | ICD-10-CM | POA: Diagnosis present

## 2015-03-22 DIAGNOSIS — E1152 Type 2 diabetes mellitus with diabetic peripheral angiopathy with gangrene: Secondary | ICD-10-CM | POA: Diagnosis present

## 2015-03-22 DIAGNOSIS — R627 Adult failure to thrive: Secondary | ICD-10-CM | POA: Diagnosis present

## 2015-03-22 DIAGNOSIS — Z90710 Acquired absence of both cervix and uterus: Secondary | ICD-10-CM

## 2015-03-22 DIAGNOSIS — F0150 Vascular dementia without behavioral disturbance: Secondary | ICD-10-CM | POA: Diagnosis present

## 2015-03-22 DIAGNOSIS — G309 Alzheimer's disease, unspecified: Secondary | ICD-10-CM | POA: Diagnosis not present

## 2015-03-22 DIAGNOSIS — L97419 Non-pressure chronic ulcer of right heel and midfoot with unspecified severity: Secondary | ICD-10-CM | POA: Diagnosis present

## 2015-03-22 DIAGNOSIS — Z789 Other specified health status: Secondary | ICD-10-CM | POA: Diagnosis not present

## 2015-03-22 DIAGNOSIS — F0280 Dementia in other diseases classified elsewhere without behavioral disturbance: Secondary | ICD-10-CM | POA: Diagnosis present

## 2015-03-22 DIAGNOSIS — E875 Hyperkalemia: Secondary | ICD-10-CM | POA: Diagnosis present

## 2015-03-22 DIAGNOSIS — D649 Anemia, unspecified: Secondary | ICD-10-CM | POA: Diagnosis present

## 2015-03-22 DIAGNOSIS — L97409 Non-pressure chronic ulcer of unspecified heel and midfoot with unspecified severity: Secondary | ICD-10-CM | POA: Diagnosis present

## 2015-03-22 DIAGNOSIS — M726 Necrotizing fasciitis: Secondary | ICD-10-CM | POA: Diagnosis present

## 2015-03-22 DIAGNOSIS — I9581 Postprocedural hypotension: Secondary | ICD-10-CM | POA: Diagnosis not present

## 2015-03-22 DIAGNOSIS — N179 Acute kidney failure, unspecified: Secondary | ICD-10-CM | POA: Diagnosis present

## 2015-03-22 DIAGNOSIS — R6521 Severe sepsis with septic shock: Secondary | ICD-10-CM | POA: Diagnosis not present

## 2015-03-22 DIAGNOSIS — R410 Disorientation, unspecified: Secondary | ICD-10-CM

## 2015-03-22 DIAGNOSIS — E1165 Type 2 diabetes mellitus with hyperglycemia: Secondary | ICD-10-CM | POA: Diagnosis present

## 2015-03-22 DIAGNOSIS — A419 Sepsis, unspecified organism: Principal | ICD-10-CM | POA: Diagnosis present

## 2015-03-22 DIAGNOSIS — G301 Alzheimer's disease with late onset: Secondary | ICD-10-CM | POA: Diagnosis present

## 2015-03-22 DIAGNOSIS — E440 Moderate protein-calorie malnutrition: Secondary | ICD-10-CM | POA: Diagnosis present

## 2015-03-22 DIAGNOSIS — R531 Weakness: Secondary | ICD-10-CM

## 2015-03-22 DIAGNOSIS — L8994 Pressure ulcer of unspecified site, stage 4: Secondary | ICD-10-CM

## 2015-03-22 DIAGNOSIS — R509 Fever, unspecified: Secondary | ICD-10-CM | POA: Diagnosis present

## 2015-03-22 DIAGNOSIS — R4182 Altered mental status, unspecified: Secondary | ICD-10-CM | POA: Diagnosis present

## 2015-03-22 DIAGNOSIS — F05 Delirium due to known physiological condition: Secondary | ICD-10-CM

## 2015-03-22 DIAGNOSIS — F0283 Dementia in other diseases classified elsewhere, unspecified severity, with mood disturbance: Secondary | ICD-10-CM | POA: Diagnosis present

## 2015-03-22 DIAGNOSIS — N189 Chronic kidney disease, unspecified: Secondary | ICD-10-CM

## 2015-03-22 DIAGNOSIS — E119 Type 2 diabetes mellitus without complications: Secondary | ICD-10-CM

## 2015-03-22 DIAGNOSIS — Z888 Allergy status to other drugs, medicaments and biological substances status: Secondary | ICD-10-CM | POA: Diagnosis not present

## 2015-03-22 HISTORY — PX: INCISION AND DRAINAGE PERIRECTAL ABSCESS: SHX1804

## 2015-03-22 LAB — CBG MONITORING, ED: GLUCOSE-CAPILLARY: 232 mg/dL — AB (ref 65–99)

## 2015-03-22 LAB — CBC WITH DIFFERENTIAL/PLATELET
BASOS ABS: 0 10*3/uL (ref 0.0–0.1)
BASOS PCT: 0 %
EOS PCT: 0 %
Eosinophils Absolute: 0 10*3/uL (ref 0.0–0.7)
HCT: 24.8 % — ABNORMAL LOW (ref 36.0–46.0)
Hemoglobin: 7.9 g/dL — ABNORMAL LOW (ref 12.0–15.0)
Lymphocytes Relative: 4 %
Lymphs Abs: 0.5 10*3/uL — ABNORMAL LOW (ref 0.7–4.0)
MCH: 29.6 pg (ref 26.0–34.0)
MCHC: 31.9 g/dL (ref 30.0–36.0)
MCV: 92.9 fL (ref 78.0–100.0)
MONO ABS: 0.2 10*3/uL (ref 0.1–1.0)
Monocytes Relative: 1 %
Neutro Abs: 12.9 10*3/uL — ABNORMAL HIGH (ref 1.7–7.7)
Neutrophils Relative %: 95 %
PLATELETS: 347 10*3/uL (ref 150–400)
RBC: 2.67 MIL/uL — ABNORMAL LOW (ref 3.87–5.11)
RDW: 15.1 % (ref 11.5–15.5)
WBC: 13.6 10*3/uL — ABNORMAL HIGH (ref 4.0–10.5)

## 2015-03-22 LAB — LACTIC ACID, PLASMA
LACTIC ACID, VENOUS: 3.1 mmol/L — AB (ref 0.5–2.0)
Lactic Acid, Venous: 3.3 mmol/L (ref 0.5–2.0)

## 2015-03-22 LAB — URINALYSIS, ROUTINE W REFLEX MICROSCOPIC
Glucose, UA: NEGATIVE mg/dL
Hgb urine dipstick: NEGATIVE
Ketones, ur: NEGATIVE mg/dL
NITRITE: NEGATIVE
PH: 5 (ref 5.0–8.0)
Protein, ur: 30 mg/dL — AB
SPECIFIC GRAVITY, URINE: 1.02 (ref 1.005–1.030)

## 2015-03-22 LAB — COMPREHENSIVE METABOLIC PANEL
ALBUMIN: 2.3 g/dL — AB (ref 3.5–5.0)
ALT: 114 U/L — ABNORMAL HIGH (ref 14–54)
AST: 127 U/L — AB (ref 15–41)
Alkaline Phosphatase: 182 U/L — ABNORMAL HIGH (ref 38–126)
Anion gap: 12 (ref 5–15)
BUN: 86 mg/dL — ABNORMAL HIGH (ref 6–20)
CHLORIDE: 108 mmol/L (ref 101–111)
CO2: 26 mmol/L (ref 22–32)
CREATININE: 3.29 mg/dL — AB (ref 0.44–1.00)
Calcium: 8.9 mg/dL (ref 8.9–10.3)
GFR calc Af Amer: 16 mL/min — ABNORMAL LOW (ref >60)
GFR calc non Af Amer: 14 mL/min — ABNORMAL LOW (ref >60)
GLUCOSE: 228 mg/dL — AB (ref 65–99)
Potassium: 6 mmol/L — ABNORMAL HIGH (ref 3.5–5.1)
Sodium: 146 mmol/L — ABNORMAL HIGH (ref 135–145)
Total Bilirubin: 1.1 mg/dL (ref 0.3–1.2)
Total Protein: 7.2 g/dL (ref 6.5–8.1)

## 2015-03-22 LAB — BASIC METABOLIC PANEL
Anion gap: 11 (ref 5–15)
BUN: 80 mg/dL — ABNORMAL HIGH (ref 6–20)
CO2: 22 mmol/L (ref 22–32)
Calcium: 8.2 mg/dL — ABNORMAL LOW (ref 8.9–10.3)
Chloride: 113 mmol/L — ABNORMAL HIGH (ref 101–111)
Creatinine, Ser: 3.09 mg/dL — ABNORMAL HIGH (ref 0.44–1.00)
GFR, EST AFRICAN AMERICAN: 17 mL/min — AB (ref >60)
GFR, EST NON AFRICAN AMERICAN: 15 mL/min — AB (ref >60)
GLUCOSE: 250 mg/dL — AB (ref 65–99)
POTASSIUM: 5.2 mmol/L — AB (ref 3.5–5.1)
Sodium: 146 mmol/L — ABNORMAL HIGH (ref 135–145)

## 2015-03-22 LAB — URINE MICROSCOPIC-ADD ON
RBC / HPF: NONE SEEN RBC/hpf (ref 0–5)
SQUAMOUS EPITHELIAL / LPF: NONE SEEN
WBC UA: NONE SEEN WBC/hpf (ref 0–5)

## 2015-03-22 LAB — GLUCOSE, CAPILLARY: GLUCOSE-CAPILLARY: 190 mg/dL — AB (ref 65–99)

## 2015-03-22 SURGERY — INCISION AND DRAINAGE, ABSCESS, PERIRECTAL
Anesthesia: General | Site: Coccyx

## 2015-03-22 MED ORDER — VASOPRESSIN 20 UNIT/ML IV SOLN
INTRAVENOUS | Status: DC | PRN
  Administered 2015-03-22: 2 [IU] via INTRAVENOUS
  Administered 2015-03-22 (×3): 1 [IU] via INTRAVENOUS

## 2015-03-22 MED ORDER — ONDANSETRON HCL 4 MG/2ML IJ SOLN: 4.0000 mg | Freq: Four times a day (QID) | INTRAMUSCULAR | Status: DC | PRN

## 2015-03-22 MED ORDER — DONEPEZIL HCL 10 MG PO TABS
10.0000 mg | ORAL_TABLET | Freq: Every day | ORAL | Status: DC
  Filled 2015-03-22: qty 1

## 2015-03-22 MED ORDER — ONDANSETRON 4 MG PO TBDP
4.0000 mg | ORAL_TABLET | Freq: Four times a day (QID) | ORAL | Status: DC | PRN
  Filled 2015-03-22: qty 2

## 2015-03-22 MED ORDER — VENLAFAXINE HCL 75 MG PO TABS
75.0000 mg | ORAL_TABLET | Freq: Every day | ORAL | Status: DC
  Filled 2015-03-22 (×2): qty 1

## 2015-03-22 MED ORDER — SODIUM CHLORIDE 0.9 % IV BOLUS (SEPSIS)
1000.0000 mL | Freq: Once | INTRAVENOUS | Status: AC
  Administered 2015-03-22: 1000 mL via INTRAVENOUS

## 2015-03-22 MED ORDER — LACTATED RINGERS IV BOLUS (SEPSIS)
1000.0000 mL | Freq: Once | INTRAVENOUS | Status: AC
  Administered 2015-03-23: 1000 mL via INTRAVENOUS

## 2015-03-22 MED ORDER — PIPERACILLIN-TAZOBACTAM IN DEX 2-0.25 GM/50ML IV SOLN
2.2500 g | Freq: Four times a day (QID) | INTRAVENOUS | Status: DC
  Administered 2015-03-23 (×2): 2.25 g via INTRAVENOUS
  Filled 2015-03-22 (×3): qty 50

## 2015-03-22 MED ORDER — PHENYLEPHRINE HCL 10 MG/ML IJ SOLN
INTRAMUSCULAR | Status: AC
  Filled 2015-03-22: qty 1

## 2015-03-22 MED ORDER — LACTATED RINGERS IV BOLUS (SEPSIS): 1000.0000 mL | Freq: Three times a day (TID) | INTRAVENOUS | Status: DC | PRN

## 2015-03-22 MED ORDER — PHENYLEPHRINE HCL 10 MG/ML IJ SOLN
10.0000 mg | INTRAVENOUS | Status: DC | PRN
  Administered 2015-03-22: 40 ug/min via INTRAVENOUS

## 2015-03-22 MED ORDER — PROPOFOL 10 MG/ML IV BOLUS
INTRAVENOUS | Status: DC | PRN
  Administered 2015-03-22: 100 mg via INTRAVENOUS

## 2015-03-22 MED ORDER — LIDOCAINE HCL (CARDIAC) 20 MG/ML IV SOLN
INTRAVENOUS | Status: AC
  Filled 2015-03-22: qty 5

## 2015-03-22 MED ORDER — FENTANYL CITRATE (PF) 100 MCG/2ML IJ SOLN
INTRAMUSCULAR | Status: DC | PRN
  Administered 2015-03-22: 25 ug via INTRAVENOUS

## 2015-03-22 MED ORDER — BISACODYL 10 MG RE SUPP
10.0000 mg | Freq: Every day | RECTAL | Status: DC
  Administered 2015-03-25: 10 mg via RECTAL
  Filled 2015-03-22: qty 1

## 2015-03-22 MED ORDER — SODIUM CHLORIDE 0.9 % IV SOLN
INTRAVENOUS | Status: DC | PRN
  Administered 2015-03-22: 22:00:00 via INTRAVENOUS

## 2015-03-22 MED ORDER — PIPERACILLIN-TAZOBACTAM IN DEX 2-0.25 GM/50ML IV SOLN
2.2500 g | Freq: Four times a day (QID) | INTRAVENOUS | Status: DC
  Filled 2015-03-22 (×2): qty 50

## 2015-03-22 MED ORDER — POLYETHYLENE GLYCOL 3350 17 G PO PACK
17.0000 g | PACK | Freq: Two times a day (BID) | ORAL | Status: DC
  Filled 2015-03-22 (×2): qty 1

## 2015-03-22 MED ORDER — LIDOCAINE HCL (CARDIAC) 20 MG/ML IV SOLN
INTRAVENOUS | Status: DC | PRN
  Administered 2015-03-22: 60 mg via INTRAVENOUS

## 2015-03-22 MED ORDER — PHENYLEPHRINE HCL 10 MG/ML IJ SOLN
INTRAMUSCULAR | Status: DC | PRN
  Administered 2015-03-22 (×2): 80 ug via INTRAVENOUS
  Administered 2015-03-22: 40 ug via INTRAVENOUS
  Administered 2015-03-22: 80 ug via INTRAVENOUS
  Administered 2015-03-22: 40 ug via INTRAVENOUS
  Administered 2015-03-22: 120 ug via INTRAVENOUS
  Administered 2015-03-22: 80 ug via INTRAVENOUS

## 2015-03-22 MED ORDER — MAGIC MOUTHWASH
15.0000 mL | Freq: Four times a day (QID) | ORAL | Status: DC | PRN
  Filled 2015-03-22: qty 15

## 2015-03-22 MED ORDER — SODIUM CHLORIDE 0.9 % IV SOLN
8.0000 mg | Freq: Four times a day (QID) | INTRAVENOUS | Status: DC | PRN
  Filled 2015-03-22: qty 4

## 2015-03-22 MED ORDER — ONDANSETRON HCL 4 MG/2ML IJ SOLN
INTRAMUSCULAR | Status: AC
  Filled 2015-03-22: qty 2

## 2015-03-22 MED ORDER — FENTANYL CITRATE (PF) 100 MCG/2ML IJ SOLN
INTRAMUSCULAR | Status: AC
  Filled 2015-03-22: qty 2

## 2015-03-22 MED ORDER — FENTANYL CITRATE (PF) 100 MCG/2ML IJ SOLN: 25.0000 ug | INTRAMUSCULAR | Status: DC | PRN

## 2015-03-22 MED ORDER — LIP MEDEX EX OINT
1.0000 "application " | TOPICAL_OINTMENT | Freq: Two times a day (BID) | CUTANEOUS | Status: DC
  Administered 2015-03-23 – 2015-03-25 (×4): 1 via TOPICAL
  Filled 2015-03-22 (×2): qty 7

## 2015-03-22 MED ORDER — PROPOFOL 10 MG/ML IV BOLUS
INTRAVENOUS | Status: AC
  Filled 2015-03-22: qty 20

## 2015-03-22 MED ORDER — ACETAMINOPHEN 160 MG/5ML PO SOLN
650.0000 mg | Freq: Once | ORAL | Status: AC
  Administered 2015-03-22: 650 mg via ORAL
  Filled 2015-03-22: qty 20.3

## 2015-03-22 MED ORDER — PHENYLEPHRINE 40 MCG/ML (10ML) SYRINGE FOR IV PUSH (FOR BLOOD PRESSURE SUPPORT)
PREFILLED_SYRINGE | INTRAVENOUS | Status: AC
  Filled 2015-03-22: qty 20

## 2015-03-22 MED ORDER — VANCOMYCIN HCL IN DEXTROSE 1-5 GM/200ML-% IV SOLN: 1000.0000 mg | INTRAVENOUS | Status: DC

## 2015-03-22 MED ORDER — VANCOMYCIN HCL 10 G IV SOLR
1500.0000 mg | Freq: Once | INTRAVENOUS | Status: AC
  Administered 2015-03-22: 1500 mg via INTRAVENOUS
  Filled 2015-03-22: qty 1500

## 2015-03-22 MED ORDER — ATORVASTATIN CALCIUM 10 MG PO TABS: 20.0000 mg | ORAL_TABLET | Freq: Every day | ORAL | Status: DC

## 2015-03-22 MED ORDER — MENTHOL 3 MG MT LOZG
1.0000 | LOZENGE | OROMUCOSAL | Status: DC | PRN
  Filled 2015-03-22: qty 9

## 2015-03-22 MED ORDER — PHENOL 1.4 % MT LIQD
2.0000 | OROMUCOSAL | Status: DC | PRN
  Filled 2015-03-22: qty 177

## 2015-03-22 MED ORDER — PIPERACILLIN-TAZOBACTAM 3.375 G IVPB 30 MIN
3.3750 g | Freq: Once | INTRAVENOUS | Status: AC
  Administered 2015-03-22: 3.375 g via INTRAVENOUS
  Filled 2015-03-22: qty 50

## 2015-03-22 MED ORDER — ONDANSETRON HCL 4 MG/2ML IJ SOLN: 4.0000 mg | Freq: Once | INTRAMUSCULAR | Status: DC | PRN

## 2015-03-22 MED ORDER — INSULIN ASPART 100 UNIT/ML ~~LOC~~ SOLN
0.0000 [IU] | SUBCUTANEOUS | Status: DC
  Administered 2015-03-23: 5 [IU] via SUBCUTANEOUS
  Administered 2015-03-23: 3 [IU] via SUBCUTANEOUS
  Administered 2015-03-23: 8 [IU] via SUBCUTANEOUS

## 2015-03-22 MED ORDER — SODIUM CHLORIDE 0.9 % IR SOLN
Status: DC | PRN
  Administered 2015-03-22: 3000 mL

## 2015-03-22 MED ORDER — SODIUM CHLORIDE 0.9 % IV BOLUS (SEPSIS)
500.0000 mL | INTRAVENOUS | Status: AC
  Administered 2015-03-22: 500 mL via INTRAVENOUS

## 2015-03-22 MED ORDER — ASPIRIN EC 325 MG PO TBEC: 325.0000 mg | DELAYED_RELEASE_TABLET | Freq: Every day | ORAL | Status: DC

## 2015-03-22 MED ORDER — ROCURONIUM BROMIDE 100 MG/10ML IV SOLN
INTRAVENOUS | Status: AC
  Filled 2015-03-22: qty 1

## 2015-03-22 MED ORDER — DIPHENHYDRAMINE HCL 50 MG/ML IJ SOLN: 12.5000 mg | Freq: Four times a day (QID) | INTRAMUSCULAR | Status: DC | PRN

## 2015-03-22 MED ORDER — DAKINS (1/4 STRENGTH) 0.125 % EX SOLN
Freq: Two times a day (BID) | CUTANEOUS | Status: DC
Start: 2015-03-22 — End: 2015-03-22
  Filled 2015-03-22: qty 473

## 2015-03-22 MED ORDER — SENNA-DOCUSATE SODIUM 8.6-50 MG PO TABS: 1.0000 | ORAL_TABLET | ORAL | Status: DC | PRN

## 2015-03-22 MED ORDER — 0.9 % SODIUM CHLORIDE (POUR BTL) OPTIME
TOPICAL | Status: DC | PRN
  Administered 2015-03-22: 1000 mL

## 2015-03-22 MED ORDER — SENNOSIDES-DOCUSATE SODIUM 8.6-50 MG PO TABS: 1.0000 | ORAL_TABLET | Freq: Every evening | ORAL | Status: DC | PRN

## 2015-03-22 MED ORDER — ALUM & MAG HYDROXIDE-SIMETH 200-200-20 MG/5ML PO SUSP: 30.0000 mL | Freq: Four times a day (QID) | ORAL | Status: DC | PRN

## 2015-03-22 MED ORDER — EPHEDRINE SULFATE 50 MG/ML IJ SOLN
INTRAMUSCULAR | Status: DC | PRN
  Administered 2015-03-22 (×4): 5 mg via INTRAVENOUS

## 2015-03-22 SURGICAL SUPPLY — 27 items
BLADE HEX COATED 2.75 (ELECTRODE) ×3 IMPLANT
BLADE SURG 15 STRL LF DISP TIS (BLADE) ×1 IMPLANT
BLADE SURG 15 STRL SS (BLADE) ×2
BNDG GAUZE ELAST 4 BULKY (GAUZE/BANDAGES/DRESSINGS) ×3 IMPLANT
BRIEF STRETCH FOR OB PAD LRG (UNDERPADS AND DIAPERS) ×3 IMPLANT
COVER SURGICAL LIGHT HANDLE (MISCELLANEOUS) ×3 IMPLANT
DRAPE LAPAROTOMY T 102X78X121 (DRAPES) ×3 IMPLANT
DRSG PAD ABDOMINAL 8X10 ST (GAUZE/BANDAGES/DRESSINGS) ×3 IMPLANT
ELECT PENCIL ROCKER SW 15FT (MISCELLANEOUS) ×3 IMPLANT
ELECT REM PT RETURN 9FT ADLT (ELECTROSURGICAL) ×3
ELECTRODE REM PT RTRN 9FT ADLT (ELECTROSURGICAL) ×1 IMPLANT
GAUZE SPONGE 4X4 12PLY STRL (GAUZE/BANDAGES/DRESSINGS) ×3 IMPLANT
GAUZE SPONGE 4X4 16PLY XRAY LF (GAUZE/BANDAGES/DRESSINGS) ×3 IMPLANT
GLOVE ECLIPSE 8.0 STRL XLNG CF (GLOVE) ×3 IMPLANT
GLOVE INDICATOR 8.0 STRL GRN (GLOVE) ×3 IMPLANT
GOWN STRL REUS W/TWL XL LVL3 (GOWN DISPOSABLE) ×6 IMPLANT
KIT BASIN OR (CUSTOM PROCEDURE TRAY) ×3 IMPLANT
LUBRICANT JELLY K Y 4OZ (MISCELLANEOUS) ×3 IMPLANT
NEEDLE HYPO 22GX1.5 SAFETY (NEEDLE) ×3 IMPLANT
PACK BASIC VI WITH GOWN DISP (CUSTOM PROCEDURE TRAY) ×3 IMPLANT
PAD ABD 8X10 STRL (GAUZE/BANDAGES/DRESSINGS) ×6 IMPLANT
SPONGE LAP 18X18 X RAY DECT (DISPOSABLE) ×3 IMPLANT
SYR 20CC LL (SYRINGE) ×3 IMPLANT
SYR BULB IRRIGATION 50ML (SYRINGE) ×3 IMPLANT
TOWEL OR 17X26 10 PK STRL BLUE (TOWEL DISPOSABLE) ×3 IMPLANT
TOWEL OR NON WOVEN STRL DISP B (DISPOSABLE) ×3 IMPLANT
YANKAUER SUCT BULB TIP 10FT TU (MISCELLANEOUS) ×3 IMPLANT

## 2015-03-22 NOTE — ED Notes (Signed)
Below order not completed by EW. 

## 2015-03-22 NOTE — Discharge Instructions (Signed)
WOUND CARE  It is important that the wound be kept open.   -Keeping the skin edges apart will allow the wound to gradually heal from the base upwards.   - If the skin edges of the wound close too early, a new fluid pocket can form and infection can occur. -This is the reason to pack deeper wounds with gauze or ribbon -This is why drained wounds cannot be sewed closed right away  A healthy wound should form a lining of bright red "beefy" granulating tissue that will help shrink the wound and help the edges grow new skin into it.   -A little mucus / yellow discharge is normal (the body's natural way to try and form a scab) and should be gently washed off with soap and water with daily dressing changes.  -Green or foul smelling drainage implies bacterial colonization and can slow wound healing - a short course of antibiotic ointment (3-5 days) can help it clear up.  Call the doctor if it does not improve or worsens  -Avoid use of antibiotic ointments for more than a week as they can slow wound healing over time.    -Sometimes other wound care products will be used to reduce need for dressing changes and/or help clean up dirty wounds -Sometimes the surgeon needs to debride the wound in the office to remove dead or infected tissue out of the wound so it can heal more quickly and safely.    Change the dressing at least once a day -Wash the wound with mild soap and water gently every day.  It is good to shower or bathe the wound to help it clean out. -Use clean 4x4 gauze for medium/large wounds or ribbon plain NU-gauze for smaller wounds (it does not need to be sterile, just clean) -Keep the raw wound moist with a little saline or KY (saline) gel on the gauze.  -A dry wound will take longer to heal.  -Keep the skin dry around the wound to prevent breakdown and irritation. -Pack the wound down to the base -The goal is to keep the skin apart, not overpack the wound -Use a Q-tip or blunt-tipped kabob  stick toothpick to push the gauze down to the base in narrow or deep wounds   -Cover with a clean gauze and tape -paper or Medipore tape tend to be gentle on the skin -rotate the orientation of the tape to avoid repeated stress/trauma on the skin -using an ACE or Coban wrap on wounds on arms or legs can be used instead.  Complete all antibiotics through the entire prescription to help the infection heal and prevent new places of infection   Returning the see the surgeon is helpful to follow the healing process and help the wound close as fast as possible.  Pressure Injury A pressure injury, sometimes called a bedsore, is an injury to the skin and underlying tissue caused by pressure. Pressure on blood vessels causes decreased blood flow to the skin, which can eventually cause the skin tissue to die and break down into a wound. Pressure injuries usually occur:  Over bony parts of the body such as the tailbone, shoulders, elbows, hips, and heels.  Under medical devices such as respiratory equipment, stockings, tubes, and splints. Pressure injuries start as reddened areas on the skin and can lead to pain, muscle damage, and infection. Pressure injuries can vary in severity.  CAUSES Pressure injuries are caused by a lack of blood supply to an area of skin.  They can occur from intense pressure over a short period of time or from less intense pressure over a long period of time. RISK FACTORS This condition is more likely to develop in people who:  Are in the hospital or an extended care facility.  Are bedridden or in a wheelchair.  Have an injury or disease that keeps them from:  Moving normally.  Feeling pain or pressure.  Have a condition that:  Makes them sleepy or less alert.  Causes poor blood flow.  Need to wear a medical device.  Have poor control of their bladder or bowel functions (incontinence).  Have poor nutrition (malnutrition).  Are of certain ethnicities. People  of African American and Latino or Hispanic descent are at higher risk compared to other ethnic groups. If you are at risk for pressure ulcers, your health care provider may recommend certain types of bedding to help prevent them. These may include foam or gel mattresses covered with one of the following:  A sheepskin blanket.  A pad that is filled with gel, air, water, or foam. SYMPTOMS  The main symptom is a blister or change in skin color that opens into a wound. Other symptoms include:   Red or dark areas of skin that do not turn white or pale when pressed with a finger.   Pain, warmth, or change of skin texture.  DIAGNOSIS This condition is diagnosed with a medical history and physical exam. You may also have tests, including:   Blood tests to check for infection or signs of poor nutrition.  Imaging studies to check for damage to the deep tissues under your skin.  Blood flow studies. Your pressure injury will be staged to determine its severity. Staging is an assessment of:  The depth of the pressure injury.  Which tissues are exposed because of the pressure injury.  The causes of the pressure injury. TREATMENT The main focus of treatment is to help your injury heal. This may be done by:   Relieving or redistributing pressure on your skin. This includes:  Frequently changing your position.  Eliminating or minimizing positions that caused the wound or that can make the wound worse.  Using specific bed mattresses and chair cushions.  Refitting, resizing, or replacing any medical devices, or padding the skin under them.  Using creams or powders to prevent rubbing (friction) on the skin.  Keeping your skin clean and dry. This may include using a skin cleanser or skin protectant as told by your health care provider. This may be a lotion, ointment, or spray.  Cleaning your injury and removing any dead tissue from the wound (debridement).  Placing a bandage (dressing)  over your injury.  Preventing or treating infection. This may include antibiotic, antimicrobial, or antiseptic medicines. Treatment may also include medicine for pain. Sometimes surgery is needed to close the wound with a flap of healthy skin or a piece of skin from another area of your body (graft). You may need surgery if other treatments are not working or if your injury is very deep. HOME CARE INSTRUCTIONS Wound Care  Follow instructions from your health care provider about:  How to take care of your wound.  When and how you should change your dressing.  When you should remove your dressing. If your dressing is dry and stuck when you try to remove it, moisten or wet the dressing with saline or water so that it can be removed without harming your skin or wound tissue.  Check your  wound every day for signs of infection. Have a caregiver do this for you if you are not able. Watch for:  More redness, swelling, or pain.  More fluid, blood, or pus.  A bad smell. Skin Care  Keep your skin clean and dry. Gently pat your skin dry.  Do not rub or massage your skin.  Use a skin protectant only as told by your health care provider.  Check your skin every day for any changes in color or any new blisters or sores (ulcers). Have a caregiver do this for you if you are not able. Medicines  Take over-the-counter and prescription medicines only as told by your health care provider.  If you were prescribed an antibiotic medicine, take it or apply it as told by your health care provider. Do not stop taking or using the antibiotic even if your condition improves. Reducing and Redistributing Pressure  Do not lie or sit in one position for a long time. Move or change position every two hours or as told by your health care provider.  Use pillows or cushions to reduce pressure. Ask your health care provider to recommend cushions or pads for you.  Use medical devices that do not rub your skin.  Tell your health care provider if one of your medical devices is causing a pressure injury to develop. General Instructions  Eat a healthy diet that includes lots of protein. Ask your health care provider for diet advice.  Drink enough fluid to keep your urine clear or pale yellow.  Be as active as you can every day. Ask your health care provider to suggest safe exercises or activities.  Do not abuse drugs or alcohol.  Keep all follow-up visits as told by your health care provider. This is important.  Do not smoke. SEEK MEDICAL CARE IF:  You have chills or fever.  Your pain medicine is not helping.  You have any changes in skin color.  You have new blisters or sores.  You develop warmth, redness, or swelling near a pressure injury.  You have a bad odor or pus coming from your pressure injury.  You lose control of your bowels or bladder.  You develop new symptoms.  Your wound does not improve after 1-2 weeks of treatment.  You develop a new medical condition, such as diabetes, peripheral vascular disease, or conditions that affect your defense (immune) system.   This information is not intended to replace advice given to you by your health care provider. Make sure you discuss any questions you have with your health care provider.   Document Released: 03/28/2005 Document Revised: 12/17/2014 Document Reviewed: 08/06/2014 Elsevier Interactive Patient Education 2016 Elsevier Inc[employer]    Alzheimer Disease Caregiver Guide A person who has Alzheimer disease may not be able to take care of himself or herself. He or she may need help with simple tasks. The tips below can help you care for the person. MEMORY LOSS AND CONFUSION If the person is confused or cannot remember things:  Stay calm.  Respond with a short answer.  Avoid correcting him or her in a way that sounds like scolding.  Try not to take it personally, even if he or she forgets your name. BEHAVIOR CHANGES The  person may go through behavior changes. This can include depression, anxiety, anger, or seeing things that are not there. When behavior changes:  Try not to take behavior changes personally.  Stay calm and patient.  Do not argue or try to convince  the person about a specific point.  Know that these changes are part of the disease process. Try to work through it. TIPS TO LESSEN FRUSTRATION  Make appointments and do daily tasks when the person is at his or her best.  Take your time. Simple tasks may take longer. Allow plenty of time to complete tasks.  Limit choices for the person.  Involve the person in what you are doing.  Stick to a routine.  Avoid new or crowded places, if possible.  Use simple words, short sentences, and a calm voice. Only give 1 direction at a time.  Buy clothes and shoes that are easy to put on and take off.  Let people help if they offer. HOME SAFETY  Keep floors clear. Remove rugs, magazine racks, and floor lamps.  Keep hallways well lit.  Put a handrail and nonslip mat in the bathtub or shower.  Put childproof locks on cabinets that have dangerous items in them. These items include medicine, alcohol, guns, toxic cleaning items, sharp tools, matches, or lighters.  Place locks on doors where the person cannot see or reach them. This helps the person to not wander out of the house and get lost.  Be prepared for emergencies. Keep a list of emergency phone numbers and addresses in a handy area. PLANS FOR THE FUTURE   Talk about finances.  Talk about money management. People with Alzheimer disease have trouble managing their money as the disease gets worse.  Get help from professional advisors about financial and legal matters.  Talk about future care.  Choose a power of attorney. This is someone who can make decisions for the person with Alzheimer disease when he or she can no longer do so.  Talk about driving and when it is the right time to  stop. The person's doctor can help with this.  If the person lives alone, make sure he or she is safe. Some people need extra help at home. Other people need more care at a nursing home or care center. SUPPORT GROUPS  Some benefits of joining a support group include:   Learning ways to manage stress.  Sharing experiences with others.  Getting emotional comfort and support.  Learning new caregiving skills as the disease progresses.  Knowing what community resources are available and taking advantage of them. GET HELP IF:  The person has a fever.  The person has a sudden behavior change that does not get better with calming strategies.  The person is unable to manage his or her living situation.  The person threatens you or anyone else, including himself or herself.  You are no longer able to care for the person.   This information is not intended to replace advice given to you by your health care provider. Make sure you discuss any questions you have with your health care provider.   Document Released: 06/20/2011 Document Revised: 04/18/2014 Document Reviewed: 06/20/2011 Elsevier Interactive Patient Education [employer]

## 2015-03-22 NOTE — Op Note (Signed)
03/22/2015  11:18 PM  PATIENT:  [name] [name]  69 y.o. 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  INDICATION: Patient bedridden at home with chronic sacral and heel decubitus.  Has been getting Santyl dressing changes on skin or decubitus.  Having worsening pain.  Found to have wet gangrene and gas in sacral and gluteal regions suspicious for necrotizing fasciitis.  Her admission made for emergent operative incision and debridement given patient showing signs of sepsis.  The anatomy and physiology of the region was discussed. The pathophysiology of subcutaneous abscess formation with progression to fasciitis & sepsis was discussed.  Need for incision, drainage, debridement discussed.  I stressed good hygiene & need for repeated wound care.  Possible redebridement / reoperation was discussed as well. Possibility of recurrence was discussed.   Risks of bleeding, infection, abscess, leak, injury to other organs, need for repair of tissues / organs, need for further treatment, heart attack, death, and other risks were discussed.  Benefits, alternatives were discussed. I noted a good likelihood this will help address the problem.  Questions answered.  The patient agrees to  proceed.  OR FINDINGS: Wet gangrene necrosis of skin of her sacrum.  A 10 x 8 cm wound at skin with 25 x 15 cm undermining to both hips and necrotic gluteal muscles.  Necrosis of some gluteal muscle and some friability of sacrum midline process with necrosis of ligaments  DESCRIPTION: Informed consent was confirmed.  Patient underwent general anesthesia.  She is physician Perrone.  Her gluteal region was prepped and draped in a sterile fashion.  Surgical timeout confirmed and planned.  I used a scalpel to excise the wet gangrene dead skin to encounter a large pocket of malodorous necrotic fat and muscle.  I used scalpel and curved Mayo scissors to sharply debride large chunks of dead fat and necrotic gluteal muscles.  I could finger fracture between the skin and gluteal muscles all the way out to hip bones on both sides at certain radial pockets.  Eventually debrided most of the periosteum of the sacrum.  The sacral bone felt intact except for midline transverse process area seen on a more friable suspicious for osteomyelitis.  I debrided that off and sent that off to pathology for confirmation.  I did serial pulse irrigation of 6 L in the whole region.  We did debride skin, subcutaneous fat, gluteus muscle, and periosteal tissues & then pulse lavage.  Repeated this a few times until the wound was much cleaner with viable bleeding edges of the wound.  Assured hemostasis.  Packed the wound with 2 large Kerlixes.  Patient's blood pressure soft on Neo.  After dressings place I disimpacted the large volume of hard bulky stool of the rectum to help disimpacted  it.  Patient being expanded go to recovery room.  Condition somewhat guarded.  We will begin dressing changes tomorrow.  High likelihood she will require further debridement.  See if we can avoid more in the operating room and can transition to hydrotherapy.  Ardeth SportsmanSteven C. Brylin Stopper, M.D., F.A.C.S. [facility] [address] [place], [place] [id number] [phone] Main / Paging

## 2015-03-22 NOTE — Anesthesia Procedure Notes (Signed)
Procedure Name: Intubation Date/Time: 03/22/2015 9:58 PM Performed by: Thornell MuleSTUBBLEFIELD, Micaela Stith G Pre-anesthesia Checklist: Patient identified, Emergency Drugs available, Suction available and Patient being monitored Patient Re-evaluated:Patient Re-evaluated prior to inductionOxygen Delivery Method: Circle System Utilized Preoxygenation: Pre-oxygenation with 100% oxygen Intubation Type: IV induction Ventilation: Mask ventilation without difficulty Laryngoscope Size: Miller and 3 Grade View: Grade I Tube type: Oral Tube size: 7.5 mm Number of attempts: 1 Airway Equipment and Method: Stylet and Oral airway Placement Confirmation: ETT inserted through vocal cords under direct vision,  positive ETCO2 and breath sounds checked- equal and bilateral Secured at: 19 cm Tube secured with: Tape Dental Injury: Teeth and Oropharynx as per pre-operative assessment

## 2015-03-22 NOTE — Anesthesia Preprocedure Evaluation (Addendum)
Anesthesia Evaluation  Patient identified by MRN, date of birth, ID band Patient awake    Reviewed: Allergy & Precautions, NPO status , Patient's Chart, lab work & pertinent test results  History of Anesthesia Complications Negative for: history of anesthetic complications  Airway Mallampati: III  TM Distance: >3 FB Neck ROM: Full    Dental no notable dental hx. (+) Edentulous Upper, Edentulous Lower, Dental Advisory Given   Pulmonary neg pulmonary ROS,    Pulmonary exam normal breath sounds clear to auscultation       Cardiovascular hypertension, Pt. on medications Normal cardiovascular exam+ dysrhythmias  Rhythm:Regular Rate:Normal     Neuro/Psych  Headaches, PSYCHIATRIC DISORDERS Depression lewy body dementianegative psych ROS   GI/Hepatic Neg liver ROS, GERD  ,  Endo/Other  diabetesHypothyroidism   Renal/GU ARFRenal disease  negative genitourinary   Musculoskeletal  (+) Arthritis ,   Abdominal   Peds negative pediatric ROS (+)  Hematology negative hematology ROS (+)   Anesthesia Other Findings   Reproductive/Obstetrics negative OB ROS                            Anesthesia Physical Anesthesia Plan  ASA: III and emergent  Anesthesia Plan: General   Post-op Pain Management:    Induction: Intravenous  Airway Management Planned: Oral ETT  Additional Equipment:   Intra-op Plan:   Post-operative Plan: Extubation in OR  Informed Consent: I have reviewed the patients History and Physical, chart, labs and discussed the procedure including the risks, benefits and alternatives for the proposed anesthesia with the patient or authorized representative who has indicated his/her understanding and acceptance.   Dental advisory given  Plan Discussed with: CRNA  Anesthesia Plan Comments: (Bed bound for several months, spoke with husband and reviewed anesthetic and always risk of  remaining intubated after procedure. He was agreeable to the plan and all questions and concerns addressed)        Anesthesia Quick Evaluation

## 2015-03-22 NOTE — Transfer of Care (Signed)
Immediate Anesthesia Transfer of Care Note  Patient: [name] [name]  Procedure(s) Performed: Procedure(s): IRRIGATION AND DEBRIDEMENT SACRAL WOUND (N/A)  Patient Location: PACU  Anesthesia Type:General  Level of Consciousness: awake, alert  and confused  Airway & Oxygen Therapy: Patient Spontanous Breathing and Patient connected to face mask oxygen  Post-op Assessment: Report given to RN and Post -op Vital signs reviewed and unstable, Anesthesiologist notified  Post vital signs: Reviewed and unstable  Last Vitals:  Filed Vitals:   03/22/15 2011 03/22/15 2017  BP: 120/54 113/58  Pulse: 84 89  Temp:    Resp: 20 20    Complications: No apparent anesthesia complications

## 2015-03-22 NOTE — Progress Notes (Signed)
ANTIBIOTIC CONSULT NOTE - INITIAL  Pharmacy Consult for Vancomycin & Zosyn Indication: rule out sepsis  Allergies  Allergen Reactions  . Benazepril-Hydrochlorothiazide Other (See Comments)    unknown    Patient Measurements:   Total body weight: last weight 75kg 5/16, current weight requested  Vital Signs: Temp: 101.5 F (38.6 C) (12/11 1532) Temp Source: Rectal (12/11 1532) Intake/Output from previous day:   Intake/Output from this shift:    Labs: No results for input(s): WBC, HGB, PLT, LABCREA, CREATININE in the last 72 hours. CrCl cannot be calculated (Unknown ideal weight.). No results for input(s): VANCOTROUGH, VANCOPEAK, VANCORANDOM, GENTTROUGH, GENTPEAK, GENTRANDOM, TOBRATROUGH, TOBRAPEAK, TOBRARND, AMIKACINPEAK, AMIKACINTROU, AMIKACIN in the last 72 hours.   Microbiology: No results found for this or any previous visit (from the past 720 hour(s)).  Medical History: Past Medical History  Diagnosis Date  . 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 Chronic kidney disease   . Alzheimer disease   . DM (diabetes mellitus) (HCC)   . HLD (hyperlipidemia)   . Memory deficits   . Psychosis   . Major depression (HCC)   . Hypertension   . Migraine   . GERD (gastroesophageal reflux disease)   . Eczema   . Hypothyroidism   . Rhinitis   . Arthritis   . Dementia of the Alzheimer's type, with late onset, with depressive mood 05/01/2013  . 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 Heart murmur    Medications:  Scheduled:  . acetaminophen (TYLENOL) oral liquid 160 mg/5 mL  650 mg Oral Once   Anti-infectives    Start     Dose/Rate Route Frequency Ordered Stop   03/22/15 1630  vancomycin (VANCOCIN) 1,500 mg in sodium chloride 0.9 % 500 mL IVPB     1,500 mg 250 mL/hr over 120 Minutes Intravenous  Once 03/22/15 1558     03/22/15 1600  piperacillin-tazobactam (ZOSYN) IVPB 3.375 g     3.375 g 100 mL/hr over 30 Minutes Intravenous  Once  03/22/15 1557       Assessment: 69 yoF from home with wound infection of pressure ulcers, previous admit 11/28 -11/30 for same, no discharge antibiotics on discharge summary. Hx of Alzheimers, CKD, DM.  Rule out sepsis, begin Vancomycin & Zosyn per pharmacy dosing.  Last BMET 11/30 with SCr 1.33.  Goal of Therapy:  Vancomycin trough level 15-20 mcg/ml  Plan:   Zosyn 3.375gm x1 over 30 min, and Vancomycin 1500mg  x1 in ED  Need current labs for subsequent dosing  Otho BellowsGreen, Tymir Terral L PharmD Pager [phone] 03/22/2015, 4:26 PM

## 2015-03-22 NOTE — ED Notes (Signed)
Pt arrived with dressing to sacral area, discharge from ulcer - yellow / green and serosanguinous- leaking from dressing. Foul odor from site. Bilateral dressings to heels. Dressings dry and intact.

## 2015-03-22 NOTE — ED Notes (Signed)
Bed: [id number] Expected date: 03/22/15 Expected time: 3:11 PM Means of arrival: Ambulance Comments: Bed sore on back

## 2015-03-22 NOTE — Consult Note (Signed)
[facility]  [address] [place], [place] [address] Phone: [phone] FAX: [phone]     [name] [name]  1946-02-06 [id number]  CARE TEAM:  PCP: Philis Fendt, MD  Outpatient Care Team: Patient Care Team: Nolene Ebbs, MD as PCP - General (Internal Medicine)  Inpatient Treatment Team: Treatment Team: Attending Provider: Gareth Morgan, MD; Registered Nurse: Susette Racer, RN; Registered Nurse: Sheryle Spray, RN  This patient is a 69 y.o.female who presents today for surgical evaluation at the request of Dr Billy Fischer.   Reason for evaluation: Sacral decubitus  Elderly woman with dementia due to Alzheimer's.  He come progressively more bedridden.  She has had recurrent admissions for urinary tract infections and other issues.  Cared for at home with the help of home health care as well.  Has had heel ulcers painted with Betadine.  A chronic scab over sacrum concerning for decubitus.  Treated with topical therapy.  I believe Santyl collagenase.  Female family member at bedside.  I believe is a Doctor, hospital.  Husband gets hemodialysis.  Patient agitated and cursing.  A little delirious.  Somewhat consolable.  Apparently the sacral decubital ulcer has become more foul.  Urine output down.  Not eating well.  Concerned.  The family brought the patient into the emergency room Sunday night.  Patient was value by emergency room staff.  Agitated by by emergency room.  CT scan done showing gas.  Concern for infection.  Initially presented as chronic sacral wound but in fact the patient's skin is not broken down yet.  Hyperkalemic.  No EKG changes.  Renal failure.  Hyperglycemic.  Medicine evaluating for admission.  Surgical consultation requested.  Past Medical History  Diagnosis Date  . 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 Chronic kidney disease   . Alzheimer disease   . DM (diabetes mellitus) ([facility])   .  HLD (hyperlipidemia)   . Memory deficits   . Psychosis   . Major depression ([facility])   . Hypertension   . Migraine   . GERD (gastroesophageal reflux disease)   . Eczema   . Hypothyroidism   . Rhinitis   . Arthritis   . Dementia of the Alzheimer's type, with late onset, with depressive mood 05/01/2013  . 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 Heart murmur     Past Surgical History  Procedure Laterality Date  . Abdominal hysterectomy    . Stomach surgery      ?  Marland Kitchen Thyroid surgery    . Dental surgery      Social History   Social History  . Marital Status: Married    Spouse Name: [name] [name]  . Number of Children: N/A  . Years of Education: 12   Occupational History  . retired    Social History Main Topics  . Smoking status: Never Smoker   . Smokeless tobacco: Never Used  . Alcohol Use: No  . Drug Use: No  . Sexual Activity: Not on file   Other Topics Concern  . Not on file   Social History Narrative   Patient is married [name] [name]) and lives at home with her husband.   Patient has a high school education.   Patient is right-handed.   Patient very seldom drinks caffeine    Family History  Problem Relation Age of Onset  . Diabetes Mother   . Diabetes Brother   . Diabetes Brother   .  Diabetes Sister   . Diabetes Sister   . Diabetes Sister   . Diabetes Sister   . Diabetes Sister   . Diabetes Sister     Current Facility-Administered Medications  Medication Dose Route Frequency Provider Last Rate Last Dose  . [START ON 03/23/2015] piperacillin-tazobactam (ZOSYN) IVPB 2.25 g  2.25 g Intravenous 4 times per day Polly Cobia, RPH      . vancomycin (VANCOCIN) 1,500 mg in sodium chloride 0.9 % 500 mL IVPB  1,500 mg Intravenous Once Minda Ditto, RPH 250 mL/hr at 03/22/15 1810 1,500 mg at 03/22/15 1810  . [START ON 03/24/2015] vancomycin (VANCOCIN) IVPB 1000 mg/200 mL premix  1,000 mg Intravenous Q48H Polly Cobia, [facility]       Current Outpatient  Prescriptions  Medication Sig Dispense Refill  . aspirin EC 325 MG tablet Take 325 mg by mouth daily.    Marland Kitchen atorvastatin (LIPITOR) 20 MG tablet Take 20 mg by mouth daily.      . clonazePAM (KLONOPIN) 0.5 MG tablet Take 0.5 mg by mouth at bedtime as needed for anxiety.     . collagenase (SANTYL) ointment Apply topically daily. 15 g 0  . donepezil (ARICEPT) 10 MG tablet Take 10 mg by mouth at bedtime.    . furosemide (LASIX) 20 MG tablet Take 20 mg by mouth daily as needed (leg swelling).    Marland Kitchen omega-3 acid ethyl esters (LOVAZA) 1 G capsule Take 1 g by mouth daily.    . sennosides-docusate sodium (SENOKOT-S) 8.6-50 MG tablet Take 1 tablet by mouth as needed for constipation.    Marland Kitchen venlafaxine (EFFEXOR) 75 MG tablet Take 75 mg by mouth daily.        Allergies  Allergen Reactions  . Benazepril-Hydrochlorothiazide Other (See Comments)    unknown    ROS: Constitutional:  No fevers, chills, sweats.  Poor appetite Weight stable Eyes:  No vision changes, No discharge HENT:  No sore throats, nasal drainage Lymph: No neck swelling, No bruising easily Pulmonary:  No cough, productive sputum CV: No orthopnea, PND  GI:  No personal nor family history of GI/colon cancer, inflammatory bowel disease, irritable bowel syndrome, allergy such as Celiac Sprue, dietary/dairy problems, colitis, ulcers nor gastritis.  No recent sick contacts/gastroenteritis.  No travel outside the country.  No changes in diet. Renal: History of recurrent UTIs, No hematuria Genital:  No drainage, bleeding, masses Musculoskeletal: No severe joint pain.  Good ROM major joints Skin:  No sores or lesions.  No rashes Heme/Lymph:  No easy bleeding.  No swollen lymph nodes Neuro: No focal weakness/numbness.  No seizures Psych: No suicidal ideation.  No hallucinations  BP 104/51 mmHg  Pulse 88  Temp(Src) 101.5 F (38.6 C) (Rectal)  Resp 18  Wt 72.7 kg (160 lb 4.4 oz)  SpO2 92%  Physical Exam: General: Pt awake and agitated  but consolable.  Oriented to self only. Eyes: PERRL, normal EOM. Sclera nonicteric Neuro: CN II-XII intact w/o focal sensory/motor deficits. Lymph: No head/neck/groin lymphadenopathy Psych:  No psychosis/paranoia.  Baseline dementia with agitation concerning for acute delirium . HENT: Normocephalic, Mucus membranes moist.  No thrush Neck: Supple, No tracheal deviation Chest: No pain.  Good respiratory excursion. CV:  Pulses intact.  Regular rhythm Abdomen: Soft, Nondistended.  Nontender.  No incarcerated hernias. Genital: No vaginal bleeding or discharge Rectal: Skin clean.  Large volume of stool in rectal vault.  Mildly decreased sphincter tone but in normal range.  7 x 7 patch of wet  gangrene of the skin consistent with an infected decubitus ulcer.  For a foul order and brackish drainage.  Some mild crepitus.  Some fluctuance over the left ischial tuberosity as well  Ext:  SCDs BLE.  No significant edema.  No cyanosis Skin: No petechiae / purpurea.  No major sores Musculoskeletal: No severe joint pain.  Good ROM major joints   Results:   Labs: Results for orders placed or performed during the hospital encounter of 03/22/15 (from the past 48 hour(s))  CBC with Differential     Status: Abnormal   Collection Time: 03/22/15  4:28 PM  Result Value Ref Range   WBC 13.6 (H) 4.0 - 10.5 K/uL   RBC 2.67 (L) 3.87 - 5.11 MIL/uL   Hemoglobin 7.9 (L) 12.0 - 15.0 g/dL   HCT 24.8 (L) 36.0 - 46.0 %   MCV 92.9 78.0 - 100.0 fL   MCH 29.6 26.0 - 34.0 pg   MCHC 31.9 30.0 - 36.0 g/dL   RDW 15.1 11.5 - 15.5 %   Platelets 347 150 - 400 K/uL   Neutrophils Relative % 95 %   Neutro Abs 12.9 (H) 1.7 - 7.7 K/uL   Lymphocytes Relative 4 %   Lymphs Abs 0.5 (L) 0.7 - 4.0 K/uL   Monocytes Relative 1 %   Monocytes Absolute 0.2 0.1 - 1.0 K/uL   Eosinophils Relative 0 %   Eosinophils Absolute 0.0 0.0 - 0.7 K/uL   Basophils Relative 0 %   Basophils Absolute 0.0 0.0 - 0.1 K/uL  Comprehensive metabolic  panel     Status: Abnormal   Collection Time: 03/22/15  4:28 PM  Result Value Ref Range   Sodium 146 (H) 135 - 145 mmol/L   Potassium 6.0 (H) 3.5 - 5.1 mmol/L    Comment: SLIGHT HEMOLYSIS   Chloride 108 101 - 111 mmol/L   CO2 26 22 - 32 mmol/L   Glucose, Bld 228 (H) 65 - 99 mg/dL   BUN 86 (H) 6 - 20 mg/dL   Creatinine, Ser 3.29 (H) 0.44 - 1.00 mg/dL   Calcium 8.9 8.9 - 10.3 mg/dL   Total Protein 7.2 6.5 - 8.1 g/dL   Albumin 2.3 (L) 3.5 - 5.0 g/dL   AST 127 (H) 15 - 41 U/L   ALT 114 (H) 14 - 54 U/L   Alkaline Phosphatase 182 (H) 38 - 126 U/L   Total Bilirubin 1.1 0.3 - 1.2 mg/dL   GFR calc non Af Amer 14 (L) >60 mL/min   GFR calc Af Amer 16 (L) >60 mL/min    Comment: (NOTE) The eGFR has been calculated using the CKD EPI equation. This calculation has not been validated in all clinical situations. eGFR's persistently <60 mL/min signify possible Chronic Kidney Disease.    Anion gap 12 5 - 15  Lactic acid, plasma     Status: Abnormal   Collection Time: 03/22/15  5:54 PM  Result Value Ref Range   Lactic Acid, Venous 3.3 (HH) 0.5 - 2.0 mmol/L    Comment: CRITICAL RESULT CALLED TO, READ BACK BY AND VERIFIED WITH: MARQUEZ,A RN AT 1915 ON 12.11.16 BY EPPERSON,S     Imaging / Studies: Ct Pelvis Wo Contrast  03/22/2015  CLINICAL DATA:  Bed sore on the back to evaluate for sacral wound versus abscess. White cell count 13.6. Previous hysterectomy. EXAM: CT PELVIS WITHOUT CONTRAST TECHNIQUE: Multidetector CT imaging of the pelvis was performed following the standard protocol without intravenous contrast. COMPARISON:  CT pelvis 07/22/2007  FINDINGS: Skin defect over the sacral spine extending from about the level of L5-S1 along the midline down to the level of the coccyx. This skin defect extends from midline towards the right side of midline. There is a large amount of subcutaneous gas posterior to the sacrum and bilateral medial gluteal region. Gas collections infiltrate the subcutaneous fat  with probable focal involvement of the gluteus muscles. This could represent abscess or skin infection due to gas-forming organism. There is no loculated fluid collection. Gas extends down to the level of the sacrum. There is no obvious bone erosion to suggest osteomyelitis. Small focal gas collection appears to be within the spinal canal at the level of S 3. This could indicate degenerative change, infection, instrumentation, or possibly fat. Pelvic organs demonstrate a Foley catheter deflating the bladder. There is stool and residual contrast material in the rectum. No free or loculated pelvic fluid collections. No visualized pelvic mass or lymphadenopathy. Degenerative changes in the hips and lower lumbar spine. Degenerative disc disease at L5-S1. No displaced fractures identified. IMPRESSION: Localized but prominent soft tissue gas underlying a skin defect in the soft tissues over the sacral coccygeal spine. No obvious evidence of osteomyelitis. Possible gas within the spinal canal. Findings likely represent infection due to gas-forming organism. No loculated fluid collection. These results were called by telephone at the time of interpretation on 03/22/2015 at 7:26 pm to Dr. Gareth Morgan , who verbally acknowledged these results. Electronically Signed   By: Lucienne Capers M.D.   On: 03/22/2015 19:33   Dg Chest Portable 1 View  03/22/2015  CLINICAL DATA:  Fevers EXAM: PORTABLE CHEST - 1 VIEW COMPARISON:  03/09/2015 FINDINGS: Cardiac shadow is stable. The lungs are were well aerated bilaterally. No focal infiltrate is seen. No bony abnormality is noted. IMPRESSION: No active disease. Electronically Signed   By: Inez Catalina M.D.   On: 03/22/2015 18:27   Dg Chest Port 1 View  03/09/2015  CLINICAL DATA:  69 year old female with dementia and confusion. Leukocytosis. EXAM: PORTABLE CHEST 1 VIEW COMPARISON:  01/11/2015 and prior chest radiographs FINDINGS: The cardiomediastinal silhouette is unremarkable.  There is no evidence of focal airspace disease, pulmonary edema, suspicious pulmonary nodule/mass, pleural effusion, or pneumothorax. No acute bony abnormalities are identified. IMPRESSION: No active disease. Electronically Signed   By: Margarette Canada M.D.   On: 03/09/2015 19:17    Medications / Allergies: per chart  Antibiotics: Anti-infectives    Start     Dose/Rate Route Frequency Ordered Stop   03/24/15 1800  vancomycin (VANCOCIN) IVPB 1000 mg/200 mL premix     1,000 mg 200 mL/hr over 60 Minutes Intravenous Every 48 hours 03/22/15 1936     03/23/15 0000  piperacillin-tazobactam (ZOSYN) IVPB 2.25 g     2.25 g 100 mL/hr over 30 Minutes Intravenous 4 times per day 03/22/15 1936     03/22/15 1630  vancomycin (VANCOCIN) 1,500 mg in sodium chloride 0.9 % 500 mL IVPB     1,500 mg 250 mL/hr over 120 Minutes Intravenous  Once 03/22/15 1558     03/22/15 1600  piperacillin-tazobactam (ZOSYN) IVPB 3.375 g     3.375 g 100 mL/hr over 30 Minutes Intravenous  Once 03/22/15 1557 03/22/15 1812      Assessment  [name] [name]  69 y.o. female       Problem List:  Principal Problem:   Gangrene with decubitus ulcer, stage 4  Active Problems:   Altered mental status   Weakness  FTT (failure to thrive) in adult   Type II diabetes mellitus (HCC)   Malnutrition of moderate degree (HCC)   Acute renal failure (ARF) (HCC)   Chronic heel ulcer (HCC)   Decubitus ulcer of sacral region, stage 4    Sepsis    Delirium due to multiple etiologies   Wet gangrene over sacral decubitus ulcer 7 x 7 cm region with a 15 x 10 cm major of gas and crepitus and subcutaneous tissues going to both ischial tuberosities.  Turn for possible Fournier/necrotizing fasciitis.  Plan:  Admit.  IV antibiotic.  Foley catheter is been placed.  Check urine for urinary tract infection.  Urosepsis would be the most common etiology for her agitation and septated-like presentation.  IV antibiotics.  Agree with vancomycin  and personal and tazobactam.  Emergent operative debridement.  Convert this to an open chronic sacral decubitus wound.  I am concerned that this could be the trigger for her infection and no urosepsis as the most likely etiology..  I think we need to debride the infected and necrotic tissue.  May require repeated debridements.  The anatomy and physiology of the region was discussed. The pathophysiology of subcutaneous abscess formation with progression to fasciitis & sepsis was discussed.  Need for incision, drainage, debridement discussed.  I stressed good hygiene & need for repeated wound care.  Possible redebridement / reoperation was discussed as well. Possibility of recurrence was discussed.   Risks of bleeding, infection, abscess, leak, injury to other organs, need for repair of tissues / organs, need for further treatment, heart attack, death, and other risks were discussed.  Benefits, alternatives were discussed. I noted a good likelihood this will help address the problem.  Questions answered.  The patient's caregiver & Dr Billy Fischer agree to proceed.  We are trying to reach the husband as well since the patient is demented and delirious and is not able to give informed consent.   Hyperkalemia.  Dr Billy Fischer concerned for hemolysis in specimen and perhaps not truly accurate.  Getting stat repeat just in case No EKG changes.  Hopefully get some insulin.  Hyperglycemia.  Over 200.  Consistent with diabetes.  Needs sliding scale insulin to help control.  Would benefit from insulin now to help cover the hyperkalemia as well.  -VTE prophylaxis- SCDs, etc -mobilize as tolerated to help recovery    Adin Hector, M.D., F.A.C.S. [facility] [address] [place], [place] [address] [phone] Main / Paging   03/22/2015  Note: Portions of this report may have been transcribed using voice recognition  software. Every effort was made to ensure accuracy; however, inadvertent computerized transcription errors may be present.   Any transcriptional errors that result from this process are unintentional.

## 2015-03-22 NOTE — Progress Notes (Signed)
Pharmacy - vancomycin/Zosyn  Assessment:    Please see note from Perlie Golderri Green, PharmD earlier today for full details.  Briefly, 69 y.o. female on vancomycin and Zosyn for r/o sepsis.     CrCl returned as 16 ml/min CG  Plan:   Continue vancomycin as 1000 mg IV q48 hr.  If renal function changes appreciably overnight, would recommend checking random level w/in 24-48 hrs of initial dose.  Continue Zosyn as 2.25 g IV q6 hr.  Bernadene Personrew Omkar Stratmann, PharmD, BCPS Pager: [phone] 03/22/2015, 7:13 PM

## 2015-03-22 NOTE — Anesthesia Postprocedure Evaluation (Signed)
Anesthesia Post Note  Patient: [name] [name]  Procedure(s) Performed: Procedure(s) (LRB): IRRIGATION AND DEBRIDEMENT SACRAL WOUND (N/A)  Patient location during evaluation: ICU Anesthesia Type: General Level of consciousness: awake and confused (at baseline level of confusion and awareness) Pain management: pain level controlled Vital Signs Assessment: vitals unstable Respiratory status: spontaneous breathing, nonlabored ventilation, respiratory function stable and patient connected to nasal cannula oxygen Cardiovascular status: stable (stable on phenylephrine infusion, able to be titrated down) Postop Assessment: no signs of nausea or vomiting Anesthetic complications: no    Last Vitals:  Filed Vitals:   03/22/15 2337 03/22/15 2339  BP: 104/88 112/99  Pulse:    Temp:    Resp: 17 19    Last Pain:  Filed Vitals:   03/22/15 2341  PainSc: Asleep                 [name]

## 2015-03-22 NOTE — H&P (Signed)
[facility] Admission History and Physical       [name] B Demilio ONG:[id number] DOB: Sep 23, 1945 DOA: 03/22/2015  Referring physician: EDP PCP: Dorrene German, MD  Specialists:   Chief Complaint: Infected Sacral wound  HPI: [name] [name] is a 69 y.o. female with a history of Severe Alzheimer's Disease, HTN, DM2 who was brought to the ED from her home due to worsening of her stage III Sacral Wound.  Her caretaker is at the bedside and reports that the wound has been worsening over the past 4 weeks.   She began to have fevers and chills today and has increased foul smelling drainage for the wound.   She was evaluated in the ED and had a Se[psis workup was performed, and she was placed on IV Vancomycin and Zosyn RX.  A ct scan of the Pelvis was also performed and revealed a large area of Abscess/ gas collection without evidence of Osteomyelitis.   General Surgeon Dr. Michaell Cowing was consulted and evaluated patient in the ED and took her to the OR for Debridement.     Review of Systems: Unable to Obtain from the Patient  Past Medical History  Diagnosis Date  . 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 Chronic kidney disease   . DM (diabetes mellitus) (HCC)   . HLD (hyperlipidemia)   . Memory deficits   . Psychosis   . Major depression (HCC)   . Hypertension   . Migraine   . GERD (gastroesophageal reflux disease)   . Eczema   . Hypothyroidism   . Rhinitis   . Arthritis   . Dementia of the Alzheimer's type, with late onset, with depressive mood 05/01/2013  . 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 Heart murmur      Past Surgical History  Procedure Laterality Date  . Abdominal hysterectomy    . Stomach surgery      ?  Marland Kitchen Thyroid surgery    . Dental surgery        Prior to Admission medications   Medication Sig Start Date End Date Taking? Authorizing Provider  aspirin EC 325 MG tablet Take 325 mg by mouth daily.   Yes Historical  Provider, MD  atorvastatin (LIPITOR) 20 MG tablet Take 20 mg by mouth daily.     Yes Historical Provider, MD  clonazePAM (KLONOPIN) 0.5 MG tablet Take 0.5 mg by mouth at bedtime as needed for anxiety.    Yes Historical Provider, MD  collagenase (SANTYL) ointment Apply topically daily. 03/11/15  Yes Dorothea Ogle, MD  donepezil (ARICEPT) 10 MG tablet Take 10 mg by mouth at bedtime.   Yes Historical Provider, MD  furosemide (LASIX) 20 MG tablet Take 20 mg by mouth daily as needed (leg swelling).   Yes Historical Provider, MD  omega-3 acid ethyl esters (LOVAZA) 1 G capsule Take 1 g by mouth daily.   Yes Historical Provider, MD  sennosides-docusate sodium (SENOKOT-S) 8.6-50 MG tablet Take 1 tablet by mouth as needed for constipation.   Yes Historical Provider, MD  venlafaxine (EFFEXOR) 75 MG tablet Take 75 mg by mouth daily.    Yes Historical Provider, MD     Allergies  Allergen Reactions  . Benazepril-Hydrochlorothiazide Other (See Comments)    unknown    Social History:   Married, Lives in Home and has Personal Care   reports that she has never smoked. She has never used smokeless tobacco. She reports that she does not drink alcohol  or use illicit drugs.    Family History  Problem Relation Age of Onset  . Diabetes Mother   . Diabetes Brother   . Diabetes Brother   . Diabetes Sister   . Diabetes Sister   . Diabetes Sister   . Diabetes Sister   . Diabetes Sister   . Diabetes Sister        Physical Exam:  GEN:  Thin Agitated  69 y.o. African American female examined and in no acute distress;  Filed Vitals:   03/22/15 1922 03/22/15 1930 03/22/15 2011 03/22/15 2017  BP: 104/51 108/53 120/54 113/58  Pulse: 88  84 89  Temp:      TempSrc:      Resp: Weight:      SpO2: 92%  96% 98%   Blood pressure 113/58, pulse 89, temperature 101.5 F (38.6 C), temperature source Rectal, resp. rate 20, weight 72.7 kg (160 lb 4.4 oz), SpO2 98 %. PSYCH: She is alert and oriented x  0; does not appear anxious does not appear depressed; affect is normal HEENT: Normocephalic and Atraumatic, Mucous membranes pink; PERRLA; EOM intact; Fundi:  Benign;  No scleral icterus, Nares: Patent, Oropharynx: Clear,     Neck:  FROM, No Cervical Lymphadenopathy nor Thyromegaly or Carotid Bruit; No JVD; Breasts:: Not examined CHEST WALL: No tenderness CHEST: Normal respiration, clear to auscultation bilaterally HEART: Regular rate and rhythm; no murmurs rubs or gallops BACK: No kyphosis or scoliosis; No CVA tenderness, + Stage III-IV Sacral Decubitus Ulcer ABDOMEN: Positive Bowel Sounds, Scaphoid,  Soft Non-Tender, No Rebound or Guarding; No Masses, No Organomegaly Rectal Exam: Not done EXTREMITIES: No  Cyanosis, Clubbing, or Edema; +stage II  Calcaneal pressure Ulcers Bilaterally. Genitalia: not examined PULSES: 2+ and symmetric SKIN: Normal hydration no rash or ulceration CNS:  Alert and Oriented x  0,   Generalized Weakness Vascular: pulses palpable throughout    Labs on Admission:  Basic Metabolic Panel:  Recent Labs Lab 03/22/15 1628  NA 146*  K 6.0*  CL 108  CO2 26  GLUCOSE 228*  BUN 86*  CREATININE 3.29*  CALCIUM 8.9   Liver Function Tests:  Recent Labs Lab 03/22/15 1628  AST 127*  ALT 114*  ALKPHOS 182*  BILITOT 1.1  PROT 7.2  ALBUMIN 2.3*   No results for input(s): LIPASE, AMYLASE in the last 168 hours. No results for input(s): AMMONIA in the last 168 hours. CBC:  Recent Labs Lab 03/22/15 1628  WBC 13.6*  NEUTROABS 12.9*  HGB 7.9*  HCT 24.8*  MCV 92.9  PLT 347   Cardiac Enzymes: No results for input(s): CKTOTAL, CKMB, CKMBINDEX, TROPONINI in the last 168 hours.  BNP (last 3 results) No results for input(s): BNP in the last 8760 hours.  ProBNP (last 3 results) No results for input(s): PROBNP in the last 8760 hours.  CBG:  Recent Labs Lab 03/22/15 2019  GLUCAP 232*    Radiological Exams on Admission: Ct Pelvis Wo  Contrast  03/22/2015  CLINICAL DATA:  Bed sore on the back to evaluate for sacral wound versus abscess. White cell count 13.6. Previous hysterectomy. EXAM: CT PELVIS WITHOUT CONTRAST TECHNIQUE: Multidetector CT imaging of the pelvis was performed following the standard protocol without intravenous contrast. COMPARISON:  CT pelvis 07/22/2007 FINDINGS: Skin defect over the sacral spine extending from about the level of L5-S1 along the midline down to the level of the coccyx. This skin defect extends from midline towards the right side of  midline. There is a large amount of subcutaneous gas posterior to the sacrum and bilateral medial gluteal region. Gas collections infiltrate the subcutaneous fat with probable focal involvement of the gluteus muscles. This could represent abscess or skin infection due to gas-forming organism. There is no loculated fluid collection. Gas extends down to the level of the sacrum. There is no obvious bone erosion to suggest osteomyelitis. Small focal gas collection appears to be within the spinal canal at the level of S 3. This could indicate degenerative change, infection, instrumentation, or possibly fat. Pelvic organs demonstrate a Foley catheter deflating the bladder. There is stool and residual contrast material in the rectum. No free or loculated pelvic fluid collections. No visualized pelvic mass or lymphadenopathy. Degenerative changes in the hips and lower lumbar spine. Degenerative disc disease at L5-S1. No displaced fractures identified. IMPRESSION: Localized but prominent soft tissue gas underlying a skin defect in the soft tissues over the sacral coccygeal spine. No obvious evidence of osteomyelitis. Possible gas within the spinal canal. Findings likely represent infection due to gas-forming organism. No loculated fluid collection. These results were called by telephone at the time of interpretation on 03/22/2015 at 7:26 pm to Dr. Alvira Monday , who verbally acknowledged  these results. Electronically Signed   By: Burman Nieves M.D.   On: 03/22/2015 19:33   Dg Chest Portable 1 View  03/22/2015  CLINICAL DATA:  Fevers EXAM: PORTABLE CHEST - 1 VIEW COMPARISON:  03/09/2015 FINDINGS: Cardiac shadow is stable. The lungs are were well aerated bilaterally. No focal infiltrate is seen. No bony abnormality is noted. IMPRESSION: No active disease. Electronically Signed   By: Alcide Clever M.D.   On: 03/22/2015 18:27     EKG: Independently reviewed. Normal Sinus Rhythm rate = 86   Assessment/Plan:   69 y.o. female with  Principal Problem:   1.  Gangrene with decubitus ulcer, stage 4 /Decubitus ulcer of sacral region, stage 4    Surgical Debridement in OR by Dr Michaell Cowing   Wound Care PER Surgery   Active Problems:   2.    Sepsis    IV Vancomycin and Zosyn     3.   Hyperkalemia   Re-Check K+    If K+ > 5.2 Treat with Kayexalate      4.       Acute renal failure (ARF )(HCC)   IVFs   Monitor BUN/Cr           5.  Delirium due to multiple etiologies/Altered mental status- due to #1, and #2 and Alzheimer's Dz       6.  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 Weakness- due to #1 and #2     8. FTT (failure to thrive) in adult/ Malnutrition of moderate degree (HCC)   Nutrition Consultation     9.   Type II diabetes mellitus (HCC)   SSi coverage PRN   Check HbA1C   10.       DVT Prophylaxis   SCDs          Code Status:     FULL CODE    Family Communication:   Caretaker at Bedside,  No Family Present    Disposition Plan:    Inpatient Status        Time spent:  77 Minutes      Ron Parker [facility] Pager [phone]   If 7AM -7PM Please Contact the  Day Rounding Team MD for [facility]  If 7PM-7AM, Please Contact Night-Floor Coverage  [email or web address] Password [email or web address] 03/22/2015, 8:49 PM     ADDENDUM:   Patient was seen and examined on 03/22/2015

## 2015-03-22 NOTE — ED Notes (Signed)
Pt transported to ED by EMS from home for wound infection, pt with recent admission. Pt's caregiver at bedside.

## 2015-03-23 ENCOUNTER — Inpatient Hospital Stay (HOSPITAL_COMMUNITY): Payer: Medicare HMO

## 2015-03-23 ENCOUNTER — Encounter (HOSPITAL_COMMUNITY): Payer: Self-pay | Admitting: Surgery

## 2015-03-23 DIAGNOSIS — A419 Sepsis, unspecified organism: Secondary | ICD-10-CM

## 2015-03-23 DIAGNOSIS — N179 Acute kidney failure, unspecified: Secondary | ICD-10-CM

## 2015-03-23 DIAGNOSIS — N189 Chronic kidney disease, unspecified: Secondary | ICD-10-CM

## 2015-03-23 DIAGNOSIS — Z789 Other specified health status: Secondary | ICD-10-CM

## 2015-03-23 LAB — CBC
HEMATOCRIT: 19.4 % — AB (ref 36.0–46.0)
Hemoglobin: 6.1 g/dL — CL (ref 12.0–15.0)
MCH: 29.9 pg (ref 26.0–34.0)
MCHC: 31.4 g/dL (ref 30.0–36.0)
MCV: 95.1 fL (ref 78.0–100.0)
PLATELETS: 288 10*3/uL (ref 150–400)
RBC: 2.04 MIL/uL — ABNORMAL LOW (ref 3.87–5.11)
RDW: 15.5 % (ref 11.5–15.5)
WBC: 11.3 10*3/uL — ABNORMAL HIGH (ref 4.0–10.5)

## 2015-03-23 LAB — GLUCOSE, CAPILLARY
GLUCOSE-CAPILLARY: 112 mg/dL — AB (ref 65–99)
Glucose-Capillary: 112 mg/dL — ABNORMAL HIGH (ref 65–99)
Glucose-Capillary: 183 mg/dL — ABNORMAL HIGH (ref 65–99)
Glucose-Capillary: 206 mg/dL — ABNORMAL HIGH (ref 65–99)
Glucose-Capillary: 254 mg/dL — ABNORMAL HIGH (ref 65–99)

## 2015-03-23 LAB — BASIC METABOLIC PANEL
Anion gap: 7 (ref 5–15)
BUN: 64 mg/dL — ABNORMAL HIGH (ref 6–20)
CALCIUM: 6.9 mg/dL — AB (ref 8.9–10.3)
CO2: 20 mmol/L — ABNORMAL LOW (ref 22–32)
CREATININE: 2.33 mg/dL — AB (ref 0.44–1.00)
Chloride: 114 mmol/L — ABNORMAL HIGH (ref 101–111)
GFR, EST AFRICAN AMERICAN: 24 mL/min — AB (ref >60)
GFR, EST NON AFRICAN AMERICAN: 20 mL/min — AB (ref >60)
Glucose, Bld: 275 mg/dL — ABNORMAL HIGH (ref 65–99)
Potassium: 4.4 mmol/L (ref 3.5–5.1)
SODIUM: 141 mmol/L (ref 135–145)

## 2015-03-23 LAB — PREPARE RBC (CROSSMATCH)

## 2015-03-23 LAB — MRSA PCR SCREENING: MRSA by PCR: NEGATIVE

## 2015-03-23 LAB — APTT: aPTT: 29 seconds (ref 24–37)

## 2015-03-23 LAB — LACTIC ACID, PLASMA
LACTIC ACID, VENOUS: 3.7 mmol/L — AB (ref 0.5–2.0)
Lactic Acid, Venous: 1.1 mmol/L (ref 0.5–2.0)

## 2015-03-23 LAB — PROTIME-INR
INR: 1.38 (ref 0.00–1.49)
PROTHROMBIN TIME: 17 s — AB (ref 11.6–15.2)

## 2015-03-23 LAB — ABO/RH: ABO/RH(D): O POS

## 2015-03-23 LAB — PROCALCITONIN
PROCALCITONIN: 4.94 ng/mL
Procalcitonin: 2.84 ng/mL

## 2015-03-23 MED ORDER — CETYLPYRIDINIUM CHLORIDE 0.05 % MT LIQD
7.0000 mL | Freq: Two times a day (BID) | OROMUCOSAL | Status: DC
  Administered 2015-03-23: 7 mL via OROMUCOSAL

## 2015-03-23 MED ORDER — ACETAMINOPHEN 500 MG PO TABS: 1000.0000 mg | ORAL_TABLET | Freq: Three times a day (TID) | ORAL | Status: DC

## 2015-03-23 MED ORDER — LACTATED RINGERS IV BOLUS (SEPSIS): 1000.0000 mL | Freq: Three times a day (TID) | INTRAVENOUS | Status: DC | PRN

## 2015-03-23 MED ORDER — PHENYLEPHRINE HCL 10 MG/ML IJ SOLN
0.0000 ug/min | INTRAMUSCULAR | Status: DC
  Administered 2015-03-23: 80 ug/min via INTRAVENOUS
  Filled 2015-03-23: qty 1

## 2015-03-23 MED ORDER — HYDROMORPHONE HCL 1 MG/ML IJ SOLN
0.5000 mg | INTRAMUSCULAR | Status: DC | PRN
  Administered 2015-03-23 – 2015-03-24 (×2): 1 mg via INTRAVENOUS
  Filled 2015-03-23 (×2): qty 1

## 2015-03-23 MED ORDER — SODIUM CHLORIDE 0.9 % IV SOLN: Freq: Once | INTRAVENOUS | Status: DC

## 2015-03-23 MED ORDER — FUROSEMIDE 10 MG/ML IJ SOLN: 20.0000 mg | Freq: Once | INTRAMUSCULAR | Status: DC

## 2015-03-23 MED ORDER — PHENYLEPHRINE HCL 10 MG/ML IJ SOLN
0.0000 ug/min | INTRAMUSCULAR | Status: DC
  Administered 2015-03-23: 60 ug/min via INTRAVENOUS
  Administered 2015-03-23: 320 ug/min via INTRAVENOUS
  Administered 2015-03-23: 200 ug/min via INTRAVENOUS
  Filled 2015-03-23 (×4): qty 4

## 2015-03-23 MED ORDER — ACETAMINOPHEN 325 MG PO TABS: 650.0000 mg | ORAL_TABLET | Freq: Once | ORAL | Status: DC

## 2015-03-23 MED ORDER — HYDROMORPHONE HCL 1 MG/ML IJ SOLN
0.5000 mg | INTRAMUSCULAR | Status: DC | PRN
  Filled 2015-03-23: qty 1

## 2015-03-23 MED ORDER — NOREPINEPHRINE BITARTRATE 1 MG/ML IV SOLN
2.0000 ug/min | INTRAVENOUS | Status: DC
  Administered 2015-03-23: 2 ug/min via INTRAVENOUS
  Filled 2015-03-23 (×2): qty 4

## 2015-03-23 MED ORDER — ACETAMINOPHEN 650 MG RE SUPP: 650.0000 mg | Freq: Four times a day (QID) | RECTAL | Status: DC | PRN

## 2015-03-23 MED ORDER — OXYCODONE HCL 5 MG PO TABS: 5.0000 mg | ORAL_TABLET | ORAL | Status: DC | PRN

## 2015-03-23 MED ORDER — PIPERACILLIN-TAZOBACTAM 3.375 G IVPB
3.3750 g | Freq: Three times a day (TID) | INTRAVENOUS | Status: DC
Start: 2015-03-23 — End: 2015-03-24
  Administered 2015-03-23 – 2015-03-24 (×3): 3.375 g via INTRAVENOUS
  Filled 2015-03-23 (×3): qty 50

## 2015-03-23 MED ORDER — SODIUM CHLORIDE 0.9 % IV SOLN
INTRAVENOUS | Status: DC
  Administered 2015-03-23 – 2015-03-24 (×2): via INTRAVENOUS

## 2015-03-23 MED ORDER — LACTATED RINGERS IV BOLUS (SEPSIS)
1000.0000 mL | Freq: Once | INTRAVENOUS | Status: AC
  Administered 2015-03-23: 1000 mL via INTRAVENOUS

## 2015-03-23 MED ORDER — SODIUM CHLORIDE 0.9 % IV BOLUS (SEPSIS)
2000.0000 mL | Freq: Once | INTRAVENOUS | Status: AC
  Administered 2015-03-23: 2000 mL via INTRAVENOUS

## 2015-03-23 MED ORDER — SODIUM CHLORIDE 0.9 % IV SOLN
Freq: Once | INTRAVENOUS | Status: AC
  Administered 2015-03-23: 14:00:00 via INTRAVENOUS

## 2015-03-23 MED ORDER — MAGNESIUM HYDROXIDE 400 MG/5ML PO SUSP: 30.0000 mL | Freq: Two times a day (BID) | ORAL | Status: DC | PRN

## 2015-03-23 MED ORDER — ACETAMINOPHEN 325 MG PO TABS
650.0000 mg | ORAL_TABLET | Freq: Four times a day (QID) | ORAL | Status: DC | PRN
Start: 2015-03-23 — End: 2015-03-25

## 2015-03-23 NOTE — Progress Notes (Signed)
CRITICAL VALUE ALERT  Critical value received:  Gram negative rods grown in pediatric bottle  Date of notification:  03/23/15  Time of notification:  1737   Critical value read back:Yes.    Nurse who received alert:  Heloise PurpuraSusan Antwuan Eckley  MD notified (1st page):  Consistent with previous results  Time of first page:    MD notified (2nd page):  Time of second page:  Responding MD:    Time MD responded:

## 2015-03-23 NOTE — Progress Notes (Signed)
Name: [name] [name] MRN: [id number] DOB: [birth year]    ADMISSION DATE:  03/22/2015 CONSULTATION DATE:  03/23/15  REFERRING MD :  Dr Cena Benton  REASON FOR CONSULT: hypotension s/p surgery  HISTORY OF PRESENT ILLNESS:  69yo AAF w/dementia from alzheimers presented to ED yesterday by caregiver for a wound infection. She was noted by the surgery team to have wet gangrene on a stage 4 decubitus ulcer. She underwent an emergent operative debridement and presented back to the stepdown unit hypotensive.   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  has past surgical history that includes Abdominal hysterectomy; Stomach surgery; Thyroid surgery; and Dental surgery. Prior to Admission medications   Medication Sig Start Date End Date Taking? Authorizing Provider  aspirin EC 325 MG tablet Take 325 mg by mouth daily.   Yes Historical Provider, MD  atorvastatin (LIPITOR) 20 MG tablet Take 20 mg by mouth daily.     Yes Historical Provider, MD  clonazePAM (KLONOPIN) 0.5 MG tablet Take 0.5 mg by mouth at bedtime as needed for anxiety.    Yes Historical Provider, MD  collagenase (SANTYL) ointment Apply topically daily. 03/11/15  Yes Dorothea Ogle, MD  donepezil (ARICEPT) 10 MG tablet Take 10 mg by mouth at bedtime.   Yes Historical Provider, MD  furosemide (LASIX) 20 MG tablet Take 20 mg by mouth daily as needed (leg swelling).   Yes Historical Provider, MD  omega-3 acid ethyl esters (LOVAZA) 1 G capsule Take 1 g by mouth daily.   Yes Historical  Provider, MD  sennosides-docusate sodium (SENOKOT-S) 8.6-50 MG tablet Take 1 tablet by mouth as needed for constipation.   Yes Historical Provider, MD  venlafaxine (EFFEXOR) 75 MG tablet Take 75 mg by mouth daily.    Yes Historical Provider, MD   Allergies  Allergen Reactions  . Benazepril-Hydrochlorothiazide Other (See Comments)    unknown    FAMILY HISTORY:  family history includes Diabetes in her brother, brother, mother, sister, sister, sister, sister, sister, and sister. SOCIAL HISTORY:  reports that she has never smoked. She has never used smokeless tobacco. She reports that she does not drink alcohol or use illicit drugs.  REVIEW OF SYSTEMS:   Unable to obtain as the patient is currently somnolent  SUBJECTIVE:   VITAL SIGNS: Temp:  [97 F (36.1 C)-101.5 F (38.6 C)] 98.6 F (37 C) (12/12 0911) Pulse Rate:  [25-137] 37 (12/12 0856) Resp:  [0-29] 18 (12/12 0911) BP: (60-122)/(14-100) 120/42 mmHg (12/12 0845) SpO2:  [75 %-100 %] 90 % (12/12 0856) Arterial Line BP: (80-118)/(34-87) 87/81 mmHg (12/12 0830) Weight:  [158 lb 11.7 oz (72 kg)-160 lb 4.4 oz (72.7 kg)] 158 lb 11.7 oz (72 kg) (12/12 0400)  PHYSICAL EXAMINATION: General: Somnolent, no distress.  Neuro:  No gross focal deficits HEENT: PERRLA, moist mucous membranes. Cardiovascular: S1-S2, regular rate and rhythm, systolic murmur.  Lungs:  Clear to auscultation, no wheeze, crackles, non-labored breathing. Abdomen:  Soft, nontender, nondistended, positive bowel sounds Skin:  Intact, Stage 4 sacral decubitus ulcer currently bandaged.   Recent Labs Lab 03/22/15 1628 03/22/15 2038 03/23/15 0515  NA 146* 146* 141  K 6.0* 5.2*  4.4  CL 108 113* 114*  CO2 26 22 20*  BUN 86* 80* 64*  CREATININE 3.29* 3.09* 2.33*  GLUCOSE 228* 250* 275*    Recent Labs Lab 03/22/15 1628 03/23/15 0515  HGB 7.9* 6.1*  HCT 24.8* 19.4*  WBC 13.6* 11.3*  PLT 347 288   Ct Pelvis Wo Contrast  03/22/2015  CLINICAL DATA:  Bed  sore on the back to evaluate for sacral wound versus abscess. White cell count 13.6. Previous hysterectomy. EXAM: CT PELVIS WITHOUT CONTRAST TECHNIQUE: Multidetector CT imaging of the pelvis was performed following the standard protocol without intravenous contrast. COMPARISON:  CT pelvis 07/22/2007 FINDINGS: Skin defect over the sacral spine extending from about the level of L5-S1 along the midline down to the level of the coccyx. This skin defect extends from midline towards the right side of midline. There is a large amount of subcutaneous gas posterior to the sacrum and bilateral medial gluteal region. Gas collections infiltrate the subcutaneous fat with probable focal involvement of the gluteus muscles. This could represent abscess or skin infection due to gas-forming organism. There is no loculated fluid collection. Gas extends down to the level of the sacrum. There is no obvious bone erosion to suggest osteomyelitis. Small focal gas collection appears to be within the spinal canal at the level of S 3. This could indicate degenerative change, infection, instrumentation, or possibly fat. Pelvic organs demonstrate a Foley catheter deflating the bladder. There is stool and residual contrast material in the rectum. No free or loculated pelvic fluid collections. No visualized pelvic mass or lymphadenopathy. Degenerative changes in the hips and lower lumbar spine. Degenerative disc disease at L5-S1. No displaced fractures identified. IMPRESSION: Localized but prominent soft tissue gas underlying a skin defect in the soft tissues over the sacral coccygeal spine. No obvious evidence of osteomyelitis. Possible gas within the spinal canal. Findings likely represent infection due to gas-forming organism. No loculated fluid collection. These results were called by telephone at the time of interpretation on 03/22/2015 at 7:26 pm to Dr. Alvira MondayERIN SCHLOSSMAN , who verbally acknowledged these results. Electronically Signed   By:  Burman NievesWilliam  Stevens M.D.   On: 03/22/2015 19:33   Dg Chest Port 1 View  03/23/2015  CLINICAL DATA:  Central line placement. Acute respiratory failure. Initial encounter. EXAM: PORTABLE CHEST 1 VIEW COMPARISON:  Chest radiograph performed 03/22/2015 FINDINGS: The patient's left IJ line is noted ending about the mid to distal SVC. The lungs are well-aerated. Minimal bilateral atelectasis is noted. There is no evidence of pleural effusion or pneumothorax. The cardiomediastinal silhouette is within normal limits. No acute osseous abnormalities are seen. IMPRESSION: 1. Left IJ line noted ending about the mid to distal SVC. 2. Minimal bilateral atelectasis noted. Electronically Signed   By: Roanna RaiderJeffery  Chang M.D.   On: 03/23/2015 03:01   Dg Chest Portable 1 View  03/22/2015  CLINICAL DATA:  Fevers EXAM: PORTABLE CHEST - 1 VIEW COMPARISON:  03/09/2015 FINDINGS: Cardiac shadow is stable. The lungs are were well aerated bilaterally. No focal infiltrate is seen. No bony abnormality is noted. IMPRESSION: No active disease. Electronically Signed   By: Alcide CleverMark  Lukens M.D.   On: 03/22/2015 18:27    DISCUSSION: 69 year old with Alzheimer's status POD #1 sacral wound debridement. Now in septic shock, hypotensive post op  ASSESSMENT / PLAN:  PULMONARY A: Stable, no evidence of pneumonia. P:   Wean down O2 as tolerated  CARDIOVASCULAR A:  Shock, septic Lactic acidosis P:  Check CVP to determine fluid  needs. Check random cortisol level Lactate is normal today AM. Will continue to monitor. Wean down pressors as tolerated. On Neo and Norepi.  RENAL A:   AKI CKD at baseline P:   Urine output is picking up and creatinine improving after fluids.  GASTROINTESTINAL A:   No issues P:   Keep nothing by mouth. Swallow eval after mental status improves.  HEMATOLOGIC A:   Hb 6.1. 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   FAMILY  - Updates:  No family at bediside 12/12 - Inter-disciplinary family meet or Palliative Care meeting due by:  12/19  Additional critical care time spent- 35 mins  Chilton Greathouse MD [facility] Pager [phone] If no answer or after 3pm call: [phone] 03/23/2015, 10:41 AM

## 2015-03-23 NOTE — Progress Notes (Addendum)
Calorie Count Note  ** Addendum: Pt is currently NPO. Will assess once started on a diet.  48 hour Calorie Count was ordered. Day 1 results pending.  Please hang calorie count envelope on the patient's door. Document percent consumed for each item on the patient's meal tray ticket and keep in envelope. Also document percent of any supplement or snack pt consumes and keep documentation in envelope for RD to review.   Please consult for further nutritional issues.  Tilda FrancoLindsey Ahren Pettinger, MS, RD, LDN Pager: [phone] After Hours Pager: [phone]

## 2015-03-23 NOTE — Care Management Note (Signed)
Case Management Note  Patient Details  Name: [place] B Boniface MRN: [id number] Date of Birth: 1945/06/21  Subjective/Objective:       Wound infection             Action/Plan:Date: March 23, 2015 Chart reviewed for concurrent status and case management needs. Will continue to follow patient for changes and needs: Marcelle Smilinghonda Davis, RN, BSN, [place]   [phone]   Expected Discharge Date:                  Expected Discharge Plan:  Home/Self Care  In-House Referral:  NA  Discharge planning Services  CM Consult  Post Acute Care Choice:  NA Choice offered to:  NA  DME Arranged:    DME Agency:     HH Arranged:    HH Agency:     Status of Service:  In process, will continue to follow  Medicare Important Message Given:    Date Medicare IM Given:    Medicare IM give by:    Date Additional Medicare IM Given:    Additional Medicare Important Message give by:     If discussed at Long Length of Stay Meetings, dates discussed:    Additional Comments:  Golda AcreDavis, Rhonda Lynn, RN 03/23/2015, 8:56 AM

## 2015-03-23 NOTE — Clinical Documentation Improvement (Signed)
Internal Medicine Palliative Care Critical Care  Can the diagnosis of anemia be further specified?   Acute blood loss anemia  Acute blood loss anemia on chronic anemia (please specify cause / type)  Chronic Anemia only, including the suspected or known cause  Anemia of chronic disease, including the associated chronic disease state  Other  Clinically Undetermined  Document any associated diagnoses/conditions.    Supporting Information: Transfused 3 units PRBC.   Component      Hemoglobin  Latest Ref Rng      12.0 - 15.0 g/dL  40/98/119112/02/2015     4:784:28 PM 7.9 (L)  03/23/2015     5:15 AM 6.1 (LL)    Please exercise your independent, professional judgment when responding. A specific answer is not anticipated or expected.Please update your documentation within the medical record to reflect your response to this query.    Thank you, Doy MinceVangela Quintell Bonnin, RN [phone] Clinical Documentation Specialist

## 2015-03-23 NOTE — ED Provider Notes (Addendum)
CSN: [id number]     Arrival date & time 03/22/15  1520 History   First MD Initiated Contact with Patient 03/22/15 1522     Chief Complaint  Patient presents with  . Wound Infection     (Consider location/radiation/quality/duration/timing/severity/associated sxs/prior Treatment) HPI Comments: 1 month sacral and bilateral heel wounds Lives at home, home health/family caring for area Yesterday wound began to appear different, drainage, foul smell Developed fevers yesterday and today No cough/sob/vomiting/diarrhea Has been more agitated, hitting more over last few days   Past Medical History  Diagnosis Date  . 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 Chronic kidney disease   . DM (diabetes mellitus) (HCC)   . HLD (hyperlipidemia)   . Memory deficits   . Psychosis   . Major depression (HCC)   . Hypertension   . Migraine   . GERD (gastroesophageal reflux disease)   . Eczema   . Hypothyroidism   . Rhinitis   . Arthritis   . Dementia of the Alzheimer's type, with late onset, with depressive mood 05/01/2013  . 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 Heart murmur    Past Surgical History  Procedure Laterality Date  . Abdominal hysterectomy    . Stomach surgery      ?  Marland Kitchen Thyroid surgery    . Dental surgery     Family History  Problem Relation Age of Onset  . Diabetes Mother   . Diabetes Brother   . Diabetes Brother   . Diabetes Sister   . Diabetes Sister   . Diabetes Sister   . Diabetes Sister   . Diabetes Sister   . Diabetes Sister    Social History  Substance Use Topics  . Smoking status: Never Smoker   . Smokeless tobacco: Never Used  . Alcohol Use: No   OB History    No data available     Review of Systems  Unable to perform ROS: Dementia      Allergies  Benazepril-hydrochlorothiazide  Home Medications   Prior to Admission medications   Medication Sig Start Date End Date Taking? Authorizing Provider   aspirin EC 325 MG tablet Take 325 mg by mouth daily.   Yes Historical Provider, MD  atorvastatin (LIPITOR) 20 MG tablet Take 20 mg by mouth daily.     Yes Historical Provider, MD  clonazePAM (KLONOPIN) 0.5 MG tablet Take 0.5 mg by mouth at bedtime as needed for anxiety.    Yes Historical Provider, MD  collagenase (SANTYL) ointment Apply topically daily. 03/11/15  Yes Dorothea Ogle, MD  donepezil (ARICEPT) 10 MG tablet Take 10 mg by mouth at bedtime.   Yes Historical Provider, MD  furosemide (LASIX) 20 MG tablet Take 20 mg by mouth daily as needed (leg swelling).   Yes Historical Provider, MD  omega-3 acid ethyl esters (LOVAZA) 1 G capsule Take 1 g by mouth daily.   Yes Historical Provider, MD  sennosides-docusate sodium (SENOKOT-S) 8.6-50 MG tablet Take 1 tablet by mouth as needed for constipation.   Yes Historical Provider, MD  venlafaxine (EFFEXOR) 75 MG tablet Take 75 mg by mouth daily.    Yes Historical Provider, MD   BP 120/42 mmHg  Pulse 37  Temp(Src) 98.6 F (37 C) (Rectal)  Resp 18  Wt 158 lb 11.7 oz (72 kg)  SpO2 90% Physical Exam  Constitutional: She appears well-developed and well-nourished. She appears ill.  Confused (baseline), talking, at times agitated/cursing  HENT:  Head: Normocephalic and atraumatic.  Eyes: Conjunctivae and EOM are normal.  Neck: Normal range of motion.  Cardiovascular: Normal rate, regular rhythm, normal heart sounds and intact distal pulses.  Exam reveals no gallop and no friction rub.   No murmur heard. Pulmonary/Chest: Effort normal and breath sounds normal. No respiratory distress. She has no wheezes. She has no rales.  Abdominal: Soft. She exhibits no distension. There is no tenderness. There is no guarding.  Musculoskeletal: She exhibits no edema or tenderness.  Neurological: She is alert. GCS eye subscore is 4. GCS verbal subscore is 4. GCS motor subscore is 5.  Baseline mental status per family, however more agitated  Skin: Skin is warm and  dry. She is not diaphoretic.  Sacral wound 10cmx6cm, yellow eschar, foul odor, purulent drainage, crepitus located more superior edges of wound Erythema/induration inferior edges of buttock  Nursing note and vitals reviewed.   ED Course  Procedures (including critical care time) Labs Review Labs Reviewed  CBC WITH DIFFERENTIAL/PLATELET - Abnormal; Notable for the following:    WBC 13.6 (*)    RBC 2.67 (*)    Hemoglobin 7.9 (*)    HCT 24.8 (*)    Neutro Abs 12.9 (*)    Lymphs Abs 0.5 (*)    All other components within normal limits  COMPREHENSIVE METABOLIC PANEL - Abnormal; Notable for the following:    Sodium 146 (*)    Potassium 6.0 (*)    Glucose, Bld 228 (*)    BUN 86 (*)    Creatinine, Ser 3.29 (*)    Albumin 2.3 (*)    AST 127 (*)    ALT 114 (*)    Alkaline Phosphatase 182 (*)    GFR calc non Af Amer 14 (*)    GFR calc Af Amer 16 (*)    All other components within normal limits  URINALYSIS, ROUTINE W REFLEX MICROSCOPIC (NOT AT [facility]) - Abnormal; Notable for the following:    Color, Urine ORANGE (*)    APPearance CLOUDY (*)    Bilirubin Urine SMALL (*)    Protein, ur 30 (*)    Leukocytes, UA TRACE (*)    All other components within normal limits  LACTIC ACID, PLASMA - Abnormal; Notable for the following:    Lactic Acid, Venous 3.3 (*)    All other components within normal limits  LACTIC ACID, PLASMA - Abnormal; Notable for the following:    Lactic Acid, Venous 3.1 (*)    All other components within normal limits  URINE MICROSCOPIC-ADD ON - Abnormal; Notable for the following:    Bacteria, UA MANY (*)    All other components within normal limits  BASIC METABOLIC PANEL - Abnormal; Notable for the following:    Sodium 146 (*)    Potassium 5.2 (*)    Chloride 113 (*)    Glucose, Bld 250 (*)    BUN 80 (*)    Creatinine, Ser 3.09 (*)    Calcium 8.2 (*)    GFR calc non Af Amer 15 (*)    GFR calc Af Amer 17 (*)    All other components within normal limits   GLUCOSE, CAPILLARY - Abnormal; Notable for the following:    Glucose-Capillary 190 (*)    All other components within normal limits  LACTIC ACID, PLASMA - Abnormal; Notable for the following:    Lactic Acid, Venous 3.7 (*)    All other components within normal limits  PROTIME-INR - Abnormal; Notable for the following:  Prothrombin Time 17.0 (*)    All other components within normal limits  BASIC METABOLIC PANEL - Abnormal; Notable for the following:    Chloride 114 (*)    CO2 20 (*)    Glucose, Bld 275 (*)    BUN 64 (*)    Creatinine, Ser 2.33 (*)    Calcium 6.9 (*)    GFR calc non Af Amer 20 (*)    GFR calc Af Amer 24 (*)    All other components within normal limits  CBC - Abnormal; Notable for the following:    WBC 11.3 (*)    RBC 2.04 (*)    Hemoglobin 6.1 (*)    HCT 19.4 (*)    All other components within normal limits  GLUCOSE, CAPILLARY - Abnormal; Notable for the following:    Glucose-Capillary 206 (*)    All other components within normal limits  GLUCOSE, CAPILLARY - Abnormal; Notable for the following:    Glucose-Capillary 254 (*)    All other components within normal limits  GLUCOSE, CAPILLARY - Abnormal; Notable for the following:    Glucose-Capillary 183 (*)    All other components within normal limits  CBG MONITORING, ED - Abnormal; Notable for the following:    Glucose-Capillary 232 (*)    All other components within normal limits  CULTURE, BLOOD (ROUTINE X 2)  CULTURE, BLOOD (ROUTINE X 2)  MRSA PCR SCREENING  URINE CULTURE  LACTIC ACID, PLASMA  PROCALCITONIN  APTT  HEMOGLOBIN A1C  CORTISOL  PROCALCITONIN  I-STAT CG4 LACTIC ACID, ED  TYPE AND SCREEN  ABO/RH  PREPARE RBC (CROSSMATCH)  SURGICAL PATHOLOGY    Imaging Review Ct Pelvis Wo Contrast  03/22/2015  CLINICAL DATA:  Bed sore on the back to evaluate for sacral wound versus abscess. White cell count 13.6. Previous hysterectomy. EXAM: CT PELVIS WITHOUT CONTRAST TECHNIQUE: Multidetector CT  imaging of the pelvis was performed following the standard protocol without intravenous contrast. COMPARISON:  CT pelvis 07/22/2007 FINDINGS: Skin defect over the sacral spine extending from about the level of L5-S1 along the midline down to the level of the coccyx. This skin defect extends from midline towards the right side of midline. There is a large amount of subcutaneous gas posterior to the sacrum and bilateral medial gluteal region. Gas collections infiltrate the subcutaneous fat with probable focal involvement of the gluteus muscles. This could represent abscess or skin infection due to gas-forming organism. There is no loculated fluid collection. Gas extends down to the level of the sacrum. There is no obvious bone erosion to suggest osteomyelitis. Small focal gas collection appears to be within the spinal canal at the level of S 3. This could indicate degenerative change, infection, instrumentation, or possibly fat. Pelvic organs demonstrate a Foley catheter deflating the bladder. There is stool and residual contrast material in the rectum. No free or loculated pelvic fluid collections. No visualized pelvic mass or lymphadenopathy. Degenerative changes in the hips and lower lumbar spine. Degenerative disc disease at L5-S1. No displaced fractures identified. IMPRESSION: Localized but prominent soft tissue gas underlying a skin defect in the soft tissues over the sacral coccygeal spine. No obvious evidence of osteomyelitis. Possible gas within the spinal canal. Findings likely represent infection due to gas-forming organism. No loculated fluid collection. These results were called by telephone at the time of interpretation on 03/22/2015 at 7:26 pm to Dr. Alvira Monday , who verbally acknowledged these results. Electronically Signed   By: Burman Nieves M.D.   On:  03/22/2015 19:33   Dg Chest Port 1 View  03/23/2015  CLINICAL DATA:  Central line placement. Acute respiratory failure. Initial encounter.  EXAM: PORTABLE CHEST 1 VIEW COMPARISON:  Chest radiograph performed 03/22/2015 FINDINGS: The patient's left IJ line is noted ending about the mid to distal SVC. The lungs are well-aerated. Minimal bilateral atelectasis is noted. There is no evidence of pleural effusion or pneumothorax. The cardiomediastinal silhouette is within normal limits. No acute osseous abnormalities are seen. IMPRESSION: 1. Left IJ line noted ending about the mid to distal SVC. 2. Minimal bilateral atelectasis noted. Electronically Signed   By: Roanna Raider M.D.   On: 03/23/2015 03:01   Dg Chest Portable 1 View  03/22/2015  CLINICAL DATA:  Fevers EXAM: PORTABLE CHEST - 1 VIEW COMPARISON:  03/09/2015 FINDINGS: Cardiac shadow is stable. The lungs are were well aerated bilaterally. No focal infiltrate is seen. No bony abnormality is noted. IMPRESSION: No active disease. Electronically Signed   By: Alcide Clever M.D.   On: 03/22/2015 18:27   I have personally reviewed and evaluated these images and lab results as part of my medical decision-making.   EKG Interpretation   Date/Time:  Sunday March 22 2015 16:51:44 EST Ventricular Rate:  86 PR Interval:  143 QRS Duration: 86 QT Interval:  359 QTC Calculation: 429 R Axis:   22 Text Interpretation:  Sinus rhythm Abnormal R-wave progression, early  transition ST elevation, consider inferior injury T wave morphology of  inferior leads and V2-V3 is similar to previous EKG Confirmed by LITTLE  MD, RACHEL [phone]) on 03/22/2015 5:13:36 PM      CRITICAL CARE: Sepsis, Gas Gangrene Performed by: Rhae Lerner   Total critical care time: 30 minutes  Critical care time was exclusive of separately billable procedures and treating other patients.  Critical care was necessary to treat or prevent imminent or life-threatening deterioration.  Critical care was time spent personally by me on the following activities: development of treatment plan with patient and/or  surrogate as well as nursing, discussions with consultants, evaluation of patient's response to treatment, examination of patient, obtaining history from patient or surrogate, ordering and performing treatments and interventions, ordering and review of laboratory studies, ordering and review of radiographic studies, pulse oximetry and re-evaluation of patient's condition.  MDM   Final diagnoses:  Chronic heel ulcer, left, with fat layer exposed (HCC)  Gangrene of buttock (HCC)  Sepsis, due to unspecified organism (HCC)  Lactic acidosis  Acute-on-chronic kidney injury [facility])   69yo female with history of Alzheimer's and or Lewy Body dementia, DM II, chronic kidney disease, sacral ulcer for 1 month presents with concern for fever and foul smelling ulcer for one day.  Code sepsis initiated on arrival and patient given vancomycin/zosyn. Difficulty obtaining accurate pulse ox readings, however in 90s on room air when able to obtain.  BP 100s systolic and improved following fluid administration.  Pt with lactic acidosis, AKI, hypernatremia, elevation in transaminases likely secondary to acute illness.  Doubt cholangitis given other clear source of infection and will defer further evaluation of transaminases to inpt team.  Potassium 6 with hemolysis, no EKG changes, and ordered recheck. Anemia increased from prior, however no sign of acute GI bleed . Blood pressures initially in 120  CT ordered of pelvis showing soft tissue gas over coccyx, and question of one spot of intrathecal gas, however unclear and will defer management to inpt team.  Exam and CT concerning for gas gangrene. General Surgery consulted and  will take pt to surgery.   Dr. Lovell Sheehan will admit pt to step-down.     Alvira Monday, MD 03/23/15 1155  Alvira Monday, MD 03/23/15 [phone]

## 2015-03-23 NOTE — Clinical Documentation Improvement (Signed)
Internal Medicine Palliative Care Critical Care  Can the diagnosis of CKD be further specified?   CKD Stage II - GFR 60-89  CKD Stage III - GFR 30-59  CKD Stage IV - GFR 15-29  CKD Stage V - GFR < 15  ESRD (End Stage Renal Disease)  Other condition  Unable to clinically determine   Supporting Information: AKI, CKD currently documented in chart.  Current hospital renal labs below.   Component      BUN Creatinine  Latest Ref Rng      6 - 20 mg/dL 0.44 - 1.00 mg/dL  03/22/2015     4:28 PM 86 (H) 3.29 (H)  03/22/2015     8:38 PM 80 (H) 3.09 (H)  03/23/2015     5:15 AM 64 (H) 2.33 (H)   Component      EGFR (African American)  Latest Ref Rng      >60 mL/min  03/22/2015     4:28 PM 16 (L)  03/22/2015     8:38 PM 17 (L)  03/23/2015     5:15 AM 24 (L)   Please exercise your independent, professional judgment when responding. A specific answer is not anticipated or expected.Please update your documentation within the medical record to reflect your response to this query.  Thank you, Mateo Flow, RN [phone] Clinical Documentation Specialist

## 2015-03-23 NOTE — Consult Note (Signed)
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 She lived at home with her husband and a caregiver. Her husband has serious medical problems as well and is a hemodialysis patient. Last PM she was brought in by caregiver with worsening pain and sacral decubitus along with altered mental status. She was taken to the OR and underwent extensive debridement of the wound. Overnight she decompensated and has required admission to ICU, is requiring pressors, multiple blood transfusions and she remains mostly unresponsive.   I was unable to get her husband on the phone- he does HD MWF. I was able to reach her sister who was unaware that her sister was in the hospital- [facility]. She expressed concern that Afomia's husband may also not be abnle to make decisions for her due to his poor health condition -stating that he "just doesn't understand".   At the moment I am most concerned about her goals of care and how much of our current interventions are medically reasonable given her condition prior to admission and the progressive nature of lewy body dementia and how much awareness her family has given the fact that she is a full code and receiving pressors and multiple units of blood products.  I have tentatively scheduled a goals meeting for 930AM tomorrow morning because it is unclear if her husband will be able to meet following his dialysis- I will try to speak with  husband sooner to at least determine code status. If she declines further or experiences cardiac arrest please carefully consider her overall prognosis and consider comfort care as the most reasonable intervention should she decline rapidly.  Contacts/Participants in Discussion:Sister only by phone Primary Decision Maker: Husband  unavailable HCPOA: no   SUMMARY OF RECOMMENDATIONS  Code Status/Advance Care Planning: Full code-DEFAULT NOT DISCUSSED    Code Status Orders        Start     Ordered   03/23/15 0004  Full code   Continuous     03/23/15 0003      Other Directives:None  Symptom Management:  Currently unresponsive/minimally responsive- no non verbal signs of pain, this appears to be a significant change from last PM where she was having tremendous pain from her decubitus wound. I anticipate high risk for delirium and uncontrolled syptoms.  Palliative Prophylaxis:   Aspiration, Bowel Regimen, Delirium Protocol, Eye Care, Frequent Pain Assessment, Oral Care, Palliative Wound Care and Turn Reposition  Additional Recommendations (Limitations, Scope, Preferences):  LARGELY UNDETERMINED GOALS   Psycho-social/Spiritual:  Support System: unable to assess fully-she has a caregiver Desire for further Chaplaincy support:yes Additional Recommendations: Caregiving  Support/Resources, Education on Hospice and [facility]   Prognosis: Unable to determine- very poor may be <2 weeks  Discharge Planning: Unable to determine- she is critically ill   Chief Complaint/ Primary Diagnoses: Present on Admission:  . Chronic heel ulcer (HCC) . Acute renal failure (ARF) (HCC) . Altered mental status . FTT (failure to thrive) in adult . Malnutrition of moderate degree (HCC) . Sepsis  . Gangrene with decubitus  ulcer, stage 4  . Hyperkalemia . Dementia of the Alzheimer's type, with late onset, with depressive mood  I have reviewed the medical record, interviewed  the patient and family, and examined the patient. The following aspects are pertinent.  Past Medical History  Diagnosis Date  . 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 Chronic kidney disease   . DM (diabetes mellitus) (HCC)   . HLD (hyperlipidemia)   . Memory deficits   . Psychosis   . Major depression (HCC)   . Hypertension   . Migraine   . GERD (gastroesophageal reflux disease)   . Eczema   . Hypothyroidism   . Rhinitis   . Arthritis   . Dementia of the Alzheimer's type, with late onset, with depressive mood 05/01/2013  . 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 Heart murmur    Social History   Social History  . Marital Status: Married    Spouse Name: Fayrene Fearing  . Number of Children: N/A  . Years of Education: 12   Occupational History  . retired    Social History Main Topics  . Smoking status: Never Smoker   . Smokeless tobacco: Never Used  . Alcohol Use: No  . Drug Use: No  . Sexual Activity: Not Asked   Other Topics Concern  . None   Social History Narrative   Patient is married Fayrene Fearing) and lives at home with her husband.   Patient has a high school education.   Patient is right-handed.   Patient very seldom drinks caffeine   Family History  Problem Relation Age of Onset  . Diabetes Mother   . Diabetes Brother   . Diabetes Brother   . Diabetes Sister   . Diabetes Sister   . Diabetes Sister   . Diabetes Sister   . Diabetes Sister   . Diabetes Sister    Scheduled Meds: . sodium chloride   Intravenous Once  . sodium chloride   Intravenous Once  . acetaminophen  1,000 mg Oral TID  . acetaminophen  650 mg Oral Once  . aspirin EC  325 mg Oral Daily  . atorvastatin  20 mg Oral Daily  . bisacodyl  10 mg Rectal Daily  . donepezil  10 mg Oral QHS  . furosemide  20 mg Intravenous Once  . insulin aspart  0-15 Units Subcutaneous 6 times per day  . lip balm  1 application Topical BID  . piperacillin-tazobactam (ZOSYN)  IV   2.25 g Intravenous 4 times per day  . polyethylene glycol  17 g Oral BID  . [START ON 03/24/2015] vancomycin  1,000 mg Intravenous Q48H  . venlafaxine  75 mg Oral Daily   Continuous Infusions: . sodium chloride 75 mL/hr at 03/23/15 0018  . norepinephrine (LEVOPHED) Adult infusion 3 mcg/min (03/23/15 1154)  . phenylephrine (NEO-SYNEPHRINE) Adult infusion 60 mcg/min (03/23/15 1206)   PRN Meds:.acetaminophen **OR** acetaminophen, alum & mag hydroxide-simeth, diphenhydrAMINE, HYDROmorphone (DILAUDID) injection, HYDROmorphone (DILAUDID) injection, lactated ringers, lactated ringers, magic mouthwash, magnesium hydroxide, menthol-cetylpyridinium, ondansetron (ZOFRAN) IV **OR** ondansetron (ZOFRAN) IV, ondansetron, oxyCODONE, phenol, senna-docusate Medications Prior to Admission:  Prior to Admission medications   Medication Sig Start Date End Date Taking? Authorizing Provider  aspirin EC 325 MG tablet Take 325 mg by mouth daily.   Yes Historical Provider, MD  atorvastatin (LIPITOR) 20 MG tablet Take 20 mg by mouth daily.     Yes Historical Provider, MD  clonazePAM (KLONOPIN) 0.5 MG tablet Take 0.5  mg by mouth at bedtime as needed for anxiety.    Yes Historical Provider, MD  collagenase (SANTYL) ointment Apply topically daily. 03/11/15  Yes Dorothea Ogle, MD  donepezil (ARICEPT) 10 MG tablet Take 10 mg by mouth at bedtime.   Yes Historical Provider, MD  furosemide (LASIX) 20 MG tablet Take 20 mg by mouth daily as needed (leg swelling).   Yes Historical Provider, MD  omega-3 acid ethyl esters (LOVAZA) 1 G capsule Take 1 g by mouth daily.   Yes Historical Provider, MD  sennosides-docusate sodium (SENOKOT-S) 8.6-50 MG tablet Take 1 tablet by mouth as needed for constipation.   Yes Historical Provider, MD  venlafaxine (EFFEXOR) 75 MG tablet Take 75 mg by mouth daily.    Yes Historical Provider, MD   Allergies  Allergen Reactions  . Benazepril-Hydrochlorothiazide Other (See Comments)    unknown     Review of Systems  Physical Exam  Vital Signs: BP 137/37 mmHg  Pulse 37  Temp(Src) 98.6 F (37 C) (Rectal)  Resp 21  Wt 72 kg (158 lb 11.7 oz)  SpO2 90%  SpO2: SpO2: 90 % O2 Device:SpO2: 90 % O2 Flow Rate: .O2 Flow Rate (L/min): 6 L/min  IO: Intake/output summary:  Intake/Output Summary (Last 24 hours) at 03/23/15 1255 Last data filed at 03/23/15 1200  Gross per 24 hour  Intake 6083.77 ml  Output   1075 ml  Net 5008.77 ml    LBM: Last BM Date: 03/22/15 Baseline Weight: Weight: 72.7 kg (160 lb 4.4 oz) Most recent weight: Weight: 72 kg (158 lb 11.7 oz)      Palliative Assessment/Data:  Flowsheet Rows        Most Recent Value   Intake Tab    Referral Department  Surgery   Unit at Time of Referral  ICU   Palliative Care Primary Diagnosis  Sepsis/Infectious Disease   Date Notified  03/22/15   Palliative Care Type  New Palliative care   Reason for referral  Clarify Goals of Care   Date of Admission  03/22/15   Date first seen by Palliative Care  03/23/15   # of days Palliative referral response time  1 Day(s)   # of days IP prior to Palliative referral  0   Clinical Assessment    Palliative Performance Scale Score  10%   Pain Max last 24 hours  Not able to report   Pain Min Last 24 hours  Not able to report   Dyspnea Max Last 24 Hours  Not able to report   Dyspnea Min Last 24 hours  Not able to report   Nausea Max Last 24 Hours  Not able to report   Nausea Min Last 24 Hours  Not able to report   Anxiety Max Last 24 Hours  Not able to report   Anxiety Min Last 24 Hours  Not able to report   Other Max Last 24 Hours  Not able to report   Psychosocial & Spiritual Assessment    Palliative Care Outcomes       Additional Data Reviewed:  CBC:    Component Value Date/Time   WBC 11.3* 03/23/2015 0515   HGB 6.1* 03/23/2015 0515   HCT 19.4* 03/23/2015 0515   PLT 288 03/23/2015 0515   MCV 95.1 03/23/2015 0515   NEUTROABS 12.9* 03/22/2015 1628   LYMPHSABS 0.5*  03/22/2015 1628   MONOABS 0.2 03/22/2015 1628   EOSABS 0.0 03/22/2015 1628   BASOSABS 0.0 03/22/2015 1628  Comprehensive Metabolic Panel:    Component Value Date/Time   NA 141 03/23/2015 0515   NA 142 05/01/2013 1053   K 4.4 03/23/2015 0515   CL 114* 03/23/2015 0515   CO2 20* 03/23/2015 0515   BUN 64* 03/23/2015 0515   BUN 52* 05/01/2013 1053   CREATININE 2.33* 03/23/2015 0515   GLUCOSE 275* 03/23/2015 0515   GLUCOSE 86 05/01/2013 1053   CALCIUM 6.9* 03/23/2015 0515   AST 127* 03/22/2015 1628   ALT 114* 03/22/2015 1628   ALKPHOS 182* 03/22/2015 1628   BILITOT 1.1 03/22/2015 1628   PROT 7.2 03/22/2015 1628   ALBUMIN 2.3* 03/22/2015 1628     Time In: 12PM Time Out: 1PM Time Total: 60 minutes Greater than 50%  of this time was spent counseling and coordinating care related to the above assessment and plan.  Signed by: Hilbert Odor, DO  03/23/2015, 12:55 PM  Please contact Palliative Medicine Team phone at [phone] for questions and concerns.

## 2015-03-23 NOTE — Progress Notes (Signed)
Inpatient Diabetes Program Recommendations  AACE/ADA: New Consensus Statement on Inpatient Glycemic Control (2015)  Target Ranges:  Prepandial:   less than 140 mg/dL      Peak postprandial:   less than 180 mg/dL (1-2 hours)      Critically ill patients:  140 - 180 mg/dL   Review of Glycemic Control  Results for [name] [name], [name] [name] (MRN [id number]) as of 03/23/2015 11:39  Ref. Range 03/22/2015 20:19 03/22/2015 23:32 03/23/2015 00:23 03/23/2015 03:52 03/23/2015 07:53  Glucose-Capillary Latest Ref Range: 65-99 mg/dL 409232 (H) 811190 (H) 914206 (H) 254 (H) 183 (H)    Inpatient Diabetes Program Recommendations:  Insulin - Basal: Consider addition of Lantus 10 units QHS HgbA1C: Pending   Will continue to follow. Thank you. Ailene Ardshonda Donie Moulton, RD, LDN, CDE Inpatient Diabetes Coordinator [phone]

## 2015-03-23 NOTE — Progress Notes (Signed)
PT Cancellation Note  Patient Details Name: [name] [name] MRN: [id number] DOB: December 06, 1945   Cancelled Treatment:    Reason Eval/Treat Not Completed: Other (comment) (Spoke with RN, patient receiving blood, Palliative care consult ongoing. [facility]. )   Sharen HeckHill, [name] [name] PT [phone]  03/23/2015, 5:07 PM

## 2015-03-23 NOTE — Progress Notes (Signed)
Dr Judd at bedside 

## 2015-03-23 NOTE — Progress Notes (Signed)
PT Cancellation Note  Patient Details Name: [name] [name] MRN: [id number] DOB: 1945-07-30   Cancelled Treatment:    Reason Eval/Treat Not Completed: Patient not medically ready (chart reviewed, noted hypotension, on Neo drip, low HGB-to get RBC's. will check back with RN this PM for medical stability to begin Hydrotherapy.)   Rada HayHill, Ivania Teagarden Elizabeth 03/23/2015, 7:57 AM Blanchard KelchKaren Leilene Diprima PT [phone]

## 2015-03-23 NOTE — Progress Notes (Signed)
Neo gtt will remain infusing - per Dr Gentry RochJudd

## 2015-03-23 NOTE — Progress Notes (Signed)
PT Cancellation Note  Patient Details Name: [name] [name] MRN: [id number] DOB: Jun 21, 1945   Cancelled Treatment:    Reason Eval/Treat Not Completed: Plan for Hydrotherapy this PM, about 3:00PM. Will notify [facility] at the time to observe the wound. [name]. [name] Elizabeth 03/23/2015, 10:30 AM Blanchard KelchKaren Demarr Kluever PT [phone]

## 2015-03-23 NOTE — Progress Notes (Signed)
Patient ID: [name] [name], female   DOB: [birth year], 69 y.o.   MRN: [id number]  General Surgery [facility]  POD#: 1  Subjective: Patient in stepdown unit on medical service.  Nurse at bedside.  Objective: Vital signs in last 24 hours: Temp:  [97 F (36.1 C)-101.5 F (38.6 C)] 98.4 F (36.9 C) (12/12 0856) Pulse Rate:  [25-137] 37 (12/12 0856) Resp:  [0-29] 17 (12/12 0856) BP: (60-122)/(14-100) 120/42 mmHg (12/12 0845) SpO2:  [75 %-100 %] 90 % (12/12 0856) Arterial Line BP: (80-118)/(34-87) 87/81 mmHg (12/12 0830) Weight:  [72 kg (158 lb 11.7 oz)-72.7 kg (160 lb 4.4 oz)] 72 kg (158 lb 11.7 oz) (12/12 0400) Last BM Date: 03/22/15  Intake/Output from previous day: 12/11 0701 - 12/12 0700 In: 4423.8 [I.V.:4373.8; IV Piggyback:50] Out: 850 [Urine:650; Blood:200] Intake/Output this shift: Total I/O In: 427.5 [I.V.:397.5; Blood:30] Out: -   Physical Exam: Dressing on sacrum intact with serous/brown fluid soaking through dressing.  No bleeding.  Denies pain.  Lab Results:   Recent Labs  03/22/15 1628 03/23/15 0515  WBC 13.6* 11.3*  HGB 7.9* 6.1*  HCT 24.8* 19.4*  PLT 347 288   BMET  Recent Labs  03/22/15 2038 03/23/15 0515  NA 146* 141  K 5.2* 4.4  CL 113* 114*  CO2 22 20*  GLUCOSE 250* 275*  BUN 80* 64*  CREATININE 3.09* 2.33*  CALCIUM 8.2* 6.9*   PT/INR  Recent Labs  03/23/15 0026  LABPROT 17.0*  INR 1.38   Comprehensive Metabolic Panel:    Component Value Date/Time   NA 141 03/23/2015 0515   NA 146* 03/22/2015 2038   NA 142 05/01/2013 1053   K 4.4 03/23/2015 0515   K 5.2* 03/22/2015 2038   CL 114* 03/23/2015 0515   CL 113* 03/22/2015 2038   CO2 20* 03/23/2015 0515   CO2 22 03/22/2015 2038   BUN 64* 03/23/2015 0515   BUN 80* 03/22/2015 2038   BUN 52* 05/01/2013 1053   CREATININE 2.33* 03/23/2015 0515   CREATININE 3.09* 03/22/2015 2038   GLUCOSE 275* 03/23/2015 0515   GLUCOSE 250* 03/22/2015 2038   GLUCOSE 86  05/01/2013 1053   CALCIUM 6.9* 03/23/2015 0515   CALCIUM 8.2* 03/22/2015 2038   AST 127* 03/22/2015 1628   AST 31 01/11/2015 1038   ALT 114* 03/22/2015 1628   ALT 25 01/11/2015 1038   ALKPHOS 182* 03/22/2015 1628   ALKPHOS 83 01/11/2015 1038   BILITOT 1.1 03/22/2015 1628   BILITOT 0.6 01/11/2015 1038   PROT 7.2 03/22/2015 1628   PROT 6.9 01/11/2015 1038   ALBUMIN 2.3* 03/22/2015 1628   ALBUMIN 3.5 01/11/2015 1038    Studies/Results: Ct Pelvis Wo Contrast  03/22/2015  CLINICAL DATA:  Bed sore on the back to evaluate for sacral wound versus abscess. White cell count 13.6. Previous hysterectomy. EXAM: CT PELVIS WITHOUT CONTRAST TECHNIQUE: Multidetector CT imaging of the pelvis was performed following the standard protocol without intravenous contrast. COMPARISON:  CT pelvis 07/22/2007 FINDINGS: Skin defect over the sacral spine extending from about the level of L5-S1 along the midline down to the level of the coccyx. This skin defect extends from midline towards the right side of midline. There is a large amount of subcutaneous gas posterior to the sacrum and bilateral medial gluteal region. Gas collections infiltrate the subcutaneous fat with probable focal involvement of the gluteus muscles. This could represent abscess or skin infection due to gas-forming organism. There is no loculated fluid collection.  Gas extends down to the level of the sacrum. There is no obvious bone erosion to suggest osteomyelitis. Small focal gas collection appears to be within the spinal canal at the level of S 3. This could indicate degenerative change, infection, instrumentation, or possibly fat. Pelvic organs demonstrate a Foley catheter deflating the bladder. There is stool and residual contrast material in the rectum. No free or loculated pelvic fluid collections. No visualized pelvic mass or lymphadenopathy. Degenerative changes in the hips and lower lumbar spine. Degenerative disc disease at L5-S1. No displaced  fractures identified. IMPRESSION: Localized but prominent soft tissue gas underlying a skin defect in the soft tissues over the sacral coccygeal spine. No obvious evidence of osteomyelitis. Possible gas within the spinal canal. Findings likely represent infection due to gas-forming organism. No loculated fluid collection. These results were called by telephone at the time of interpretation on 03/22/2015 at 7:26 pm to Dr. Alvira Monday , who verbally acknowledged these results. Electronically Signed   By: Burman Nieves M.D.   On: 03/22/2015 19:33   Dg Chest Port 1 View  03/23/2015  CLINICAL DATA:  Central line placement. Acute respiratory failure. Initial encounter. EXAM: PORTABLE CHEST 1 VIEW COMPARISON:  Chest radiograph performed 03/22/2015 FINDINGS: The patient's left IJ line is noted ending about the mid to distal SVC. The lungs are well-aerated. Minimal bilateral atelectasis is noted. There is no evidence of pleural effusion or pneumothorax. The cardiomediastinal silhouette is within normal limits. No acute osseous abnormalities are seen. IMPRESSION: 1. Left IJ line noted ending about the mid to distal SVC. 2. Minimal bilateral atelectasis noted. Electronically Signed   By: Roanna Raider M.D.   On: 03/23/2015 03:01   Dg Chest Portable 1 View  03/22/2015  CLINICAL DATA:  Fevers EXAM: PORTABLE CHEST - 1 VIEW COMPARISON:  03/09/2015 FINDINGS: Cardiac shadow is stable. The lungs are were well aerated bilaterally. No focal infiltrate is seen. No bony abnormality is noted. IMPRESSION: No active disease. Electronically Signed   By: Alcide Clever M.D.   On: 03/22/2015 18:27    Anti-infectives: Anti-infectives    Start     Dose/Rate Route Frequency Ordered Stop   03/24/15 1800  vancomycin (VANCOCIN) IVPB 1000 mg/200 mL premix     1,000 mg 200 mL/hr over 60 Minutes Intravenous Every 48 hours 03/22/15 1936     03/23/15 0000  piperacillin-tazobactam (ZOSYN) IVPB 2.25 g  Status:  Discontinued     2.25  g 100 mL/hr over 30 Minutes Intravenous 4 times per day 03/22/15 1936 03/22/15 2046   03/22/15 2115  piperacillin-tazobactam (ZOSYN) IVPB 2.25 g     2.25 g 100 mL/hr over 30 Minutes Intravenous 4 times per day 03/22/15 2046     03/22/15 1630  vancomycin (VANCOCIN) 1,500 mg in sodium chloride 0.9 % 500 mL IVPB     1,500 mg 250 mL/hr over 120 Minutes Intravenous  Once 03/22/15 1558 03/22/15 2010   03/22/15 1600  piperacillin-tazobactam (ZOSYN) IVPB 3.375 g     3.375 g 100 mL/hr over 30 Minutes Intravenous  Once 03/22/15 1557 03/22/15 1812      Assessment & Plans: Sacral decubitus status post operative debridement  Dressing changes - WOC nurse to evaluate  Per medical service  Velora Heckler, MD, [facility] Office: [phone]   Aubriegh Minch Judie Petit 03/23/2015

## 2015-03-23 NOTE — Procedures (Signed)
Central Venous Catheter Insertion Procedure Note [name] [name] [id number] 1946/03/27  Procedure: Insertion of Central Venous Catheter Indications: Assessment of intravascular volume, Drug and/or fluid administration and Frequent blood sampling  Procedure Details Consent: Risks of procedure as well as the alternatives and risks of each were explained to the (patient/caregiver).  Consent for procedure obtained. Time Out: Verified patient identification, verified procedure, site/side was marked, verified correct patient position, special equipment/implants available, medications/allergies/relevent history reviewed, required imaging and test results available.  Performed  Maximum sterile technique was used including antiseptics, cap, gloves, gown, hand hygiene, mask and sheet. Skin prep: Chlorhexidine; local anesthetic administered A antimicrobial bonded/coated triple lumen catheter was placed in the left internal jugular vein using the Seldinger technique.IJ location selected due to head comfort of patient. She leans right.   Evaluation Blood flow good Complications: No apparent complications Patient did tolerate procedure well. Chest X-ray ordered to verify placement.  CXR: pending.  Emeterio Reeveddy J Dock Baccam 03/23/2015, 2:32 AM

## 2015-03-23 NOTE — Procedures (Signed)
Arterial Catheter Insertion Procedure Note [name] [name] [id number] Aug 08, 1945  Procedure: Insertion of Arterial Catheter  Indications: Blood pressure monitoring  Procedure Details Consent: Risks of procedure as well as the alternatives and risks of each were explained to the (patient/caregiver).  Consent for procedure obtained. Time Out: Verified patient identification, verified procedure, site/side was marked, verified correct patient position, special equipment/implants available, medications/allergies/relevent history reviewed, required imaging and test results available.  Performed  Maximum sterile technique was used including antiseptics, cap, gloves, gown, hand hygiene, mask and sheet. Skin prep: Chlorhexidine; local anesthetic administered 20 gauge catheter was inserted into right radial artery using the Seldinger technique.  Evaluation Blood flow good; BP tracing good. Complications: No apparent complications.   Tacy Learnurriff, Cayce Paschal E 03/23/2015

## 2015-03-23 NOTE — Progress Notes (Signed)
H and P reviewed. Events from overnight noted. Patient admitted for sepsis secondary to gangranous decub ulcer. Patient underwent emergent surgery last night with resultant significant hypotension requiring dual pressor support. This AM, patient awake and alert, albeit confused. She remains on levophed and neosynephrine.  Discussed with PCCM. For now, will follow peripherally while patient remains pressor dependent.

## 2015-03-23 NOTE — Consult Note (Signed)
Name: [name] [name] MRN: [id number] DOB: [birth year]    ADMISSION DATE:  03/22/2015 CONSULTATION DATE:  03/23/15  REFERRING MD :  Dr Cena Benton  REASON FOR CONSULT: hypotension s/p surgery  HISTORY OF PRESENT ILLNESS:  69yo AAF w/dementia from alzheimers presented to ED yesterday by caregiver for a wound infection. She was noted by the surgery team to have wet gangrene on a stage 4 decubitus ulcer. She underwent an emergent operative debridement and presented back to the stepdown unit hypotensive. 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  has past surgical history that includes Abdominal hysterectomy; Stomach surgery; Thyroid surgery; and Dental surgery. Prior to Admission medications   Medication Sig Start Date End Date Taking? Authorizing Provider  aspirin EC 325 MG tablet Take 325 mg by mouth daily.   Yes Historical Provider, MD  atorvastatin (LIPITOR) 20 MG tablet Take 20 mg by mouth daily.     Yes Historical Provider, MD  clonazePAM (KLONOPIN) 0.5 MG tablet Take 0.5 mg by mouth at bedtime as needed for anxiety.    Yes Historical Provider, MD  collagenase (SANTYL) ointment Apply topically daily. 03/11/15  Yes Dorothea Ogle, MD  donepezil (ARICEPT) 10 MG tablet Take 10 mg by mouth at bedtime.   Yes Historical Provider, MD  furosemide (LASIX) 20 MG tablet Take 20 mg by mouth daily as needed (leg swelling).   Yes Historical Provider,  MD  omega-3 acid ethyl esters (LOVAZA) 1 G capsule Take 1 g by mouth daily.   Yes Historical Provider, MD  sennosides-docusate sodium (SENOKOT-S) 8.6-50 MG tablet Take 1 tablet by mouth as needed for constipation.   Yes Historical Provider, MD  venlafaxine (EFFEXOR) 75 MG tablet Take 75 mg by mouth daily.    Yes Historical Provider, MD   Allergies  Allergen Reactions  . Benazepril-Hydrochlorothiazide Other (See Comments)    unknown    FAMILY HISTORY:  family history includes Diabetes in her brother, brother, mother, sister, sister, sister, sister, sister, and sister. SOCIAL HISTORY:  reports that she has never smoked. She has never used smokeless tobacco. She reports that she does not drink alcohol or use illicit drugs.  REVIEW OF SYSTEMS:   Unable to obtain as the patient is currently somnolent  SUBJECTIVE:   VITAL SIGNS: Temp:  [97.6 F (36.4 C)-101.5 F (38.6 C)] 98.4 F (36.9 C) (12/12 0015) Pulse Rate:  [66-137] 66 (12/11 2330) Resp:  [0-21] 19 (12/12 0015) BP: (72-120)/(37-100) 112/99 mmHg (12/11 2339) SpO2:  [75 %-98 %] 95 % (12/11 2330) Weight:  [72.7 kg (160 lb 4.4 oz)] 72.7 kg (160 lb 4.4 oz) (12/11 1900)  PHYSICAL EXAMINATION: General: 69yo AAF who appears her stated age, appears comfortable. Well nourished and well developed.  Neuro:  Neuro exam is limited by pain medications. She does not appear to have any focal deficits. She opens her eyes to stimulation. Moves all extremities.  HEENT: Head, normocephalic, atraumatic. Ears symmetric, permeable. Eyes: no conjunctival icterus, no erythema. Nose: permeable, midline septum, Clear oropharynx. Cardiovascular: S1S2 RRR 2/6  systolic murmur, no rubs, or gallops auscultated. No thrills palpated.  Lungs:  Chest symmetrical with respirations, clear to auscultation bilaterally with no wheezing, crackles, equal expansion.  Abdomen:  Soft, nontender, no guarding, nondistended. Present bowel sounds. Musculoskeletal:  No muscle  atrophy noted nor weakness. ROM intact. Gait was not assessed due to critical illness. No swelling nor tenderness of the joints.  Skin:  No notable scars, rashes, cruises. Stage 4 sacral decubitus ulcer currently bandaged.  Lymphatics: no palpable lymphadenopathy of cervical, supraclvicular nor inguinal areas.      Recent Labs Lab 03/22/15 1628 03/22/15 2038  NA 146* 146*  K 6.0* 5.2*  CL 108 113*  CO2 26 22  BUN 86* 80*  CREATININE 3.29* 3.09*  GLUCOSE 228* 250*    Recent Labs Lab 03/22/15 1628  HGB 7.9*  HCT 24.8*  WBC 13.6*  PLT 347   Ct Pelvis Wo Contrast  03/22/2015  CLINICAL DATA:  Bed sore on the back to evaluate for sacral wound versus abscess. White cell count 13.6. Previous hysterectomy. EXAM: CT PELVIS WITHOUT CONTRAST TECHNIQUE: Multidetector CT imaging of the pelvis was performed following the standard protocol without intravenous contrast. COMPARISON:  CT pelvis 07/22/2007 FINDINGS: Skin defect over the sacral spine extending from about the level of L5-S1 along the midline down to the level of the coccyx. This skin defect extends from midline towards the right side of midline. There is a large amount of subcutaneous gas posterior to the sacrum and bilateral medial gluteal region. Gas collections infiltrate the subcutaneous fat with probable focal involvement of the gluteus muscles. This could represent abscess or skin infection due to gas-forming organism. There is no loculated fluid collection. Gas extends down to the level of the sacrum. There is no obvious bone erosion to suggest osteomyelitis. Small focal gas collection appears to be within the spinal canal at the level of S 3. This could indicate degenerative change, infection, instrumentation, or possibly fat. Pelvic organs demonstrate a Foley catheter deflating the bladder. There is stool and residual contrast material in the rectum. No free or loculated pelvic fluid collections. No visualized pelvic mass or  lymphadenopathy. Degenerative changes in the hips and lower lumbar spine. Degenerative disc disease at L5-S1. No displaced fractures identified. IMPRESSION: Localized but prominent soft tissue gas underlying a skin defect in the soft tissues over the sacral coccygeal spine. No obvious evidence of osteomyelitis. Possible gas within the spinal canal. Findings likely represent infection due to gas-forming organism. No loculated fluid collection. These results were called by telephone at the time of interpretation on 03/22/2015 at 7:26 pm to Dr. Alvira MondayERIN SCHLOSSMAN , who verbally acknowledged these results. Electronically Signed   By: Burman NievesWilliam  Stevens M.D.   On: 03/22/2015 19:33   Dg Chest Portable 1 View  03/22/2015  CLINICAL DATA:  Fevers EXAM: PORTABLE CHEST - 1 VIEW COMPARISON:  03/09/2015 FINDINGS: Cardiac shadow is stable. The lungs are were well aerated bilaterally. No focal infiltrate is seen. No bony abnormality is noted. IMPRESSION: No active disease. Electronically Signed   By: Alcide CleverMark  Lukens M.D.   On: 03/22/2015 18:27    ASSESSMENT / PLAN: Septic Shock w/persistent hypotension s/p debridement of sacral decubitus ulcer-  - IVF boluses - change phenylephrine to NE - will place central line - will place a-line - cont ABx, vanc and zosyn - I placed the echo probe on her IVC and it was slightly collapsible.   Care discussed with patients caretaker and pastor both at bedside. Husband called  for consent.  Critical care time spent 45 minutes excluding procedures.   Pulmonary and Critical Care Medicine [facility] Pager: [phone]  03/23/2015, 1:16 AM

## 2015-03-23 NOTE — Progress Notes (Signed)
CRITICAL VALUE ALERT  Critical value received:  Gram negative anaerobic rods in blood culture  Date of notification:  03/23/15   Time of notification:  1105  Critical value read back:Yes.     Nurse who received alert:  Rosine DoorS Karina Lenderman   MD notified (1st page):  Yes  Time of first page:  1308  MD notified (2nd page):  Time of second page:  Responding MD: Dr Silvestre Gunnermannan  Time MD responded:  1310

## 2015-03-23 NOTE — Progress Notes (Signed)
Pharmacy - vancomycin/Zosyn  Assessment:    .  Briefly, [age] y.o. female on vancomycin and Zosyn for r/o sepsis.     CrCl improved to 22.713mls/min  1/2 Blood culture growing GNR  Plan:   Increase zosyn to 3.375mg  IV q8h  Continue vancomycin as 1000 mg IV q48 hr.  Will check level at steady state if continues  Arley Phenixllen Shatavia Santor RPh 03/23/2015, 12:59 PM Pager [phone]

## 2015-03-23 NOTE — Progress Notes (Signed)
RN called RT to bedside to assess patient A-line setup; per RN line would not flush and BP reading flat. RT changed setup and no further problems noted. Patient had no complications during change and is resting at this time. RT will continue to monitor patient.

## 2015-03-23 NOTE — Progress Notes (Signed)
Patient's sister arrived and I updated her at bedside- she reports that she is certain that her sister and her husband would want a peaceful transition and death given the current circumstances. I discussed her current code status- while I am comfortable placing a DNR order based on my conversation with her sister, I will need to confirm this with her husband once he is able to speak with me- will keep meeting for 930 in AM to discuss options for transition to comfort care. Trajectory of lewy body dementia is especially difficult for families. In teh meantime I would not escalate aggressive medical interventions already being delivered.  Anderson MaltaElizabeth Jeanne Diefendorf, DO Palliative Medicine

## 2015-03-23 NOTE — Progress Notes (Signed)
CSW consulted for SNF placement. [facility]. If pt is unable to participate in PT CSW will speak with pt / family regarding pvt pay SNF placement.  Cori RazorJamie Dnaiel Voller LCSW [phone]

## 2015-03-23 NOTE — Consult Note (Signed)
WOC wound consult note Reason for Consult: Surgical debridement of wet gangrene over sacral pressure injury 7 x 7 cm region with a 15 x 10 cm major of gas and crepitus and subcutaneous tissues going to both ischial tuberosities.  Wound type: Pressure injury Pressure Ulcer POA: Yes Measurement: Please see surgery notes.  [facility] has provided measurements and wound care orders.  Will defer to surgical team.  (NS moist gauze packed into wound BID) Dressing procedure/placement/frequency: BID NS moist kerlix, per surgery orders.  Will defer to surgery team at this time.  Will not follow at this time.  Please re-consult if needed.  Maple HudsonKaren Norbert Malkin RN BSN [facility]ON Pager [phone]

## 2015-03-24 DIAGNOSIS — Z515 Encounter for palliative care: Secondary | ICD-10-CM

## 2015-03-24 DIAGNOSIS — E440 Moderate protein-calorie malnutrition: Secondary | ICD-10-CM

## 2015-03-24 DIAGNOSIS — Z66 Do not resuscitate: Secondary | ICD-10-CM

## 2015-03-24 DIAGNOSIS — F0280 Dementia in other diseases classified elsewhere without behavioral disturbance: Secondary | ICD-10-CM

## 2015-03-24 DIAGNOSIS — I96 Gangrene, not elsewhere classified: Secondary | ICD-10-CM

## 2015-03-24 DIAGNOSIS — L89154 Pressure ulcer of sacral region, stage 4: Secondary | ICD-10-CM

## 2015-03-24 DIAGNOSIS — R627 Adult failure to thrive: Secondary | ICD-10-CM

## 2015-03-24 DIAGNOSIS — A419 Sepsis, unspecified organism: Principal | ICD-10-CM

## 2015-03-24 LAB — URINE CULTURE: CULTURE: NO GROWTH

## 2015-03-24 LAB — HEMOGLOBIN A1C
Hgb A1c MFr Bld: 7.1 % — ABNORMAL HIGH (ref 4.8–5.6)
MEAN PLASMA GLUCOSE: 157 mg/dL

## 2015-03-24 LAB — CBC
HEMATOCRIT: 25 % — AB (ref 36.0–46.0)
HEMOGLOBIN: 8.3 g/dL — AB (ref 12.0–15.0)
MCH: 28.8 pg (ref 26.0–34.0)
MCHC: 33.2 g/dL (ref 30.0–36.0)
MCV: 86.8 fL (ref 78.0–100.0)
Platelets: 254 10*3/uL (ref 150–400)
RBC: 2.88 MIL/uL — ABNORMAL LOW (ref 3.87–5.11)
RDW: 16.8 % — ABNORMAL HIGH (ref 11.5–15.5)
WBC: 10.1 10*3/uL (ref 4.0–10.5)

## 2015-03-24 LAB — GLUCOSE, CAPILLARY
GLUCOSE-CAPILLARY: 117 mg/dL — AB (ref 65–99)
GLUCOSE-CAPILLARY: 119 mg/dL — AB (ref 65–99)
Glucose-Capillary: 100 mg/dL — ABNORMAL HIGH (ref 65–99)
Glucose-Capillary: 107 mg/dL — ABNORMAL HIGH (ref 65–99)
Glucose-Capillary: 110 mg/dL — ABNORMAL HIGH (ref 65–99)

## 2015-03-24 LAB — CORTISOL: CORTISOL PLASMA: 18.8 ug/dL

## 2015-03-24 LAB — PROCALCITONIN: Procalcitonin: 4.21 ng/mL

## 2015-03-24 MED ORDER — LORAZEPAM 2 MG/ML IJ SOLN
0.5000 mg | Freq: Four times a day (QID) | INTRAMUSCULAR | Status: DC
  Administered 2015-03-24 – 2015-03-25 (×3): 0.5 mg via INTRAVENOUS
  Filled 2015-03-24 (×3): qty 1

## 2015-03-24 MED ORDER — LORAZEPAM 2 MG/ML IJ SOLN: 1.0000 mg | INTRAMUSCULAR | Status: DC | PRN

## 2015-03-24 MED ORDER — ONDANSETRON HCL 4 MG/2ML IJ SOLN: 4.0000 mg | Freq: Four times a day (QID) | INTRAMUSCULAR | Status: DC | PRN

## 2015-03-24 MED ORDER — LIP MEDEX EX OINT: 1.0000 "application " | TOPICAL_OINTMENT | Freq: Two times a day (BID) | CUTANEOUS | Status: AC

## 2015-03-24 MED ORDER — CETYLPYRIDINIUM CHLORIDE 0.05 % MT LIQD
7.0000 mL | Freq: Two times a day (BID) | OROMUCOSAL | Status: DC
  Administered 2015-03-24 (×2): 7 mL via OROMUCOSAL

## 2015-03-24 MED ORDER — VENLAFAXINE HCL ER 75 MG PO CP24
75.0000 mg | ORAL_CAPSULE | Freq: Every day | ORAL | Status: DC
  Filled 2015-03-24 (×2): qty 1

## 2015-03-24 MED ORDER — LORAZEPAM 2 MG/ML IJ SOLN: 0.5000 mg | Freq: Four times a day (QID) | INTRAMUSCULAR | Status: DC

## 2015-03-24 MED ORDER — CHLORHEXIDINE GLUCONATE 0.12 % MT SOLN
15.0000 mL | Freq: Two times a day (BID) | OROMUCOSAL | Status: DC
  Administered 2015-03-24: 15 mL via OROMUCOSAL

## 2015-03-24 MED ORDER — HYDROMORPHONE HCL 1 MG/ML IJ SOLN: 0.5000 mg | INTRAMUSCULAR | Status: DC | PRN

## 2015-03-24 MED ORDER — BISACODYL 10 MG RE SUPP: 10.0000 mg | Freq: Every day | RECTAL | Status: AC

## 2015-03-24 MED ORDER — ALUM & MAG HYDROXIDE-SIMETH 200-200-20 MG/5ML PO SUSP: 30.0000 mL | Freq: Four times a day (QID) | ORAL | Status: AC | PRN

## 2015-03-24 MED ORDER — LORAZEPAM 2 MG/ML IJ SOLN
1.0000 mg | INTRAMUSCULAR | Status: DC | PRN
  Administered 2015-03-24: 1 mg via INTRAVENOUS
  Filled 2015-03-24: qty 1

## 2015-03-24 NOTE — Progress Notes (Addendum)
Met with patient's husband, sister and caregiver Joycelyn Schmid. They shared with me the difficult road of caring for [name] [name] over the past few years and the progression of her dementia. They want peace, comfort and dignity for her and to allow for a natural death to occur- they do not want any additional aggressive interventions aimed at prolonging her life in her current state. I have recommended hospice facility for her care given her difficult to control agitation, pain and for palliative wound care. She could not toelrate hydrotherapy due to severe pain and agitation this needs to be discontinued- would appreciate palluiative wound care recs.  Social work referral to [employer]. I discussed prognosis with family. She is not eating required pressors. <2 weeks.  Time: 1000AM-10:25AM Total Time: 25 min Greater than 50%  of this time was spent counseling and coordinating care related to the above assessment and plan.   Lane Hacker, DO Palliative Medicine

## 2015-03-24 NOTE — Progress Notes (Signed)
Discussed case with Critical Care who had already seen patient today. Also review H and P on admit.   Patient presented with stage 4 sacral decub with gangrene and sepsis. Patient underwent emergent surgery on 12/11 with marked pressor dependent hypotension post-op. The patient remained under the care of Critical Care where the patient was also given blood transfusions and broad spectrum abx (vanc and zosyn). Palliative Care was later consulted. Family met with Dr. Hilma Favors on 12/13 and family expressed wishes for full comfort measures only without additional aggressive interventions to prolong her life. Pressors and abx were subsequently stopped and the patient returned to the care of the Hospitalist Service.  Plan: - DNR/DNI - Continue full comfort care - Hospice recs noted. Patient is planned for transfer to [facility] on 12/14

## 2015-03-24 NOTE — Discharge Summary (Addendum)
Physician Discharge Summary  [name] [name] ZOX:[id number] DOB: 1946-01-07 DOA: 03/22/2015  PCP: [name] German, MD  Admit date: 03/22/2015 Discharge date: 03/25/2015   Recommendations for Outpatient Follow-up:  1. Patient is discharged to [facility] facility, on full comfort measures, Follow up with PCP on as-needed basis   Discharge Diagnoses:  Principal Problem:   Sepsis  Active Problems:   Dementia of the Alzheimer's type, with late onset, with depressive mood   Altered mental status   Weakness   FTT (failure to thrive) in adult   Type II diabetes mellitus (HCC)   Malnutrition of moderate degree (HCC)   Acute renal failure (ARF) (HCC)   Chronic heel ulcer (HCC)   Decubitus ulcer of sacral region, stage 4    Delirium due to multiple etiologies   Gangrene with decubitus ulcer, stage 4    Hyperkalemia   Acute-on-chronic kidney injury [facility])   Discharge Condition: Stable  Diet recommendation: Comfort  Filed Weights   03/22/15 1900 03/23/15 0400  Weight: 160 lb 4.4 oz (72.7 kg) 158 lb 11.7 oz (72 kg)    History of present illness:  Please review dictated H and P from 12/11 for details. Briefly, patient is a 69 y.o. female with a history of severe Alzheimer's dementia, HTN, DM2 who was brought to the ED from her home due to worsening of her stage III Sacral Wound. Caregiver reported that the wound has been worsening over the past 4 weeks prior to admission. The patient was admitted for further work up of sepsis from sacral decub ulcer.  Hospital Course:   1. Gangrene with decubitus ulcer, stage 4 /Decubitus ulcer of sacral region, stage 4 with sepsis present on admission - General Surgery was consulted and the patient underwent emergent surgery on 03/22/15 - The patient returned to the stepdown/ICU hypotensive and ultimately required multiple pressor support and broad spectrum antibiotics (vancomycin and zosyn) - Care was transferred to [facility] (Critical  Care) from 12/12 until 12/13. During this time, Palliative Care was consulted and after a family meeting, decision was made for full comfort measures only. Patient was returned to the care of the Hospitalist service on 12/13. - Hospice was consulted with plans for transfer to [facility] on 12/14   2. Sepsis Present on Admission - See above   3. Hyperkalemia  - Noted initially - Ultimately resolved   4. Acute renal failure (ARF )(HCC) on CKD III - Patient presented with Cr of 3.29 - Cr ultimately improved to 2.33 on final check    5. Delirium due to multiple etiologies/Altered mental status- due to #1, and #2 and Alzheimer's Dz - Remained stable   6. Chronic heel ulcers (HCC)- Bilateral Stage II -Wound Care was consulted this admission - Surgery recommendations for wound dressings include: Light pack sacral wound with NS moistened 4" Kerlix gauze wet to dry once daily.   7.Weakness- due to #1 and #2 - Remained stable this admission   8.FTT (failure to thrive) in adult/ Malnutrition of moderate degree (HCC) - Nutrition was consulted this admission   9. Type II diabetes mellitus (HCC) - A1c noted to be 7.1 - Patient was continued on SSI coverage while admitted  10: acute post op blood loss anemia: required prbc transfusion in the hospital  11 Code status: DNR  Procedures:  Debridement of Sacral Decub Ulcer  And fecal disimpaction 12/11  L IJ central line placement by Critical Care 12/12, removed  Consultations:  General Surgery  Critical Care  Palliative Care  Discharge Exam: Filed Vitals:   03/24/15 1530 03/24/15 1600 03/24/15 1630 03/25/15 0547  BP: 101/44 96/43 91/42  138/51  Pulse: 59 55 55 75  Temp: 97.9 F (36.6 C) 97.9 F (36.6 C) 98.1 F (36.7 C) 98.1 F (36.7 C)  TempSrc:    Oral  Resp: Weight:      SpO2: 100% 100% 100% 100%    General: sleeping, seems  comfortable Cardiovascular: RRR Respiratory: nonlabored, on room air  Discharge Instructions      Discharge Instructions    Diet - low sodium heart healthy    Complete by:  As directed      Increase activity slowly    Complete by:  As directed             Medication List    STOP taking these medications        aspirin EC 325 MG tablet     atorvastatin 20 MG tablet  Commonly known as:  LIPITOR     clonazePAM 0.5 MG tablet  Commonly known as:  KLONOPIN     collagenase ointment  Commonly known as:  SANTYL     donepezil 10 MG tablet  Commonly known as:  ARICEPT     furosemide 20 MG tablet  Commonly known as:  LASIX     omega-3 acid ethyl esters 1 G capsule  Commonly known as:  LOVAZA     sennosides-docusate sodium 8.6-50 MG tablet  Commonly known as:  SENOKOT-S     venlafaxine XR 75 MG 24 hr capsule  Commonly known as:  EFFEXOR-XR      TAKE these medications        alum & mag hydroxide-simeth 200-200-20 MG/5ML suspension  Commonly known as:  MAALOX/MYLANTA  Take 30 mLs by mouth every 6 (six) hours as needed for indigestion or heartburn (or bloating).     bisacodyl 10 MG suppository  Commonly known as:  DULCOLAX  Place 1 suppository (10 mg total) rectally daily.     lip balm ointment  Apply 1 application topically 2 (two) times daily.     LORazepam 2 MG/ML concentrated solution  Commonly known as:  ATIVAN  Place 0.3 mLs (0.6 mg total) under the tongue every 4 (four) hours as needed for anxiety.     morphine 10 MG/5ML solution  Place 1.3 mLs (2.6 mg total) under the tongue every 4 (four) hours as needed for severe pain.       Allergies  Allergen Reactions  . Benazepril-Hydrochlorothiazide Other (See Comments)    unknown   Follow-up Information    Follow up with [name] German, MD.   Specialty:  Internal Medicine   Why:  As needed   Contact information:   [address] [place] [address] [phone]        The results of  significant diagnostics from this hospitalization (including imaging, microbiology, ancillary and laboratory) are listed below for reference.    Significant Diagnostic Studies: Ct Pelvis Wo Contrast  03/22/2015  CLINICAL DATA:  Bed sore on the back to evaluate for sacral wound versus abscess. White cell count 13.6. Previous hysterectomy. EXAM: CT PELVIS WITHOUT CONTRAST TECHNIQUE: Multidetector CT imaging of the pelvis was performed following the standard protocol without intravenous contrast. COMPARISON:  CT pelvis 07/22/2007 FINDINGS: Skin defect over the sacral spine extending from about the level of L5-S1 along the midline down to the level of the coccyx. This skin defect extends from midline towards  the right side of midline. There is a large amount of subcutaneous gas posterior to the sacrum and bilateral medial gluteal region. Gas collections infiltrate the subcutaneous fat with probable focal involvement of the gluteus muscles. This could represent abscess or skin infection due to gas-forming organism. There is no loculated fluid collection. Gas extends down to the level of the sacrum. There is no obvious bone erosion to suggest osteomyelitis. Small focal gas collection appears to be within the spinal canal at the level of S 3. This could indicate degenerative change, infection, instrumentation, or possibly fat. Pelvic organs demonstrate a Foley catheter deflating the bladder. There is stool and residual contrast material in the rectum. No free or loculated pelvic fluid collections. No visualized pelvic mass or lymphadenopathy. Degenerative changes in the hips and lower lumbar spine. Degenerative disc disease at L5-S1. No displaced fractures identified. IMPRESSION: Localized but prominent soft tissue gas underlying a skin defect in the soft tissues over the sacral coccygeal spine. No obvious evidence of osteomyelitis. Possible gas within the spinal canal. Findings likely represent infection due to  gas-forming organism. No loculated fluid collection. These results were called by telephone at the time of interpretation on 03/22/2015 at 7:26 pm to Dr. Alvira Monday , who verbally acknowledged these results. Electronically Signed   By: Burman Nieves M.D.   On: 03/22/2015 19:33   Dg Chest Port 1 View  03/23/2015  CLINICAL DATA:  Central line placement. Acute respiratory failure. Initial encounter. EXAM: PORTABLE CHEST 1 VIEW COMPARISON:  Chest radiograph performed 03/22/2015 FINDINGS: The patient's left IJ line is noted ending about the mid to distal SVC. The lungs are well-aerated. Minimal bilateral atelectasis is noted. There is no evidence of pleural effusion or pneumothorax. The cardiomediastinal silhouette is within normal limits. No acute osseous abnormalities are seen. IMPRESSION: 1. Left IJ line noted ending about the mid to distal SVC. 2. Minimal bilateral atelectasis noted. Electronically Signed   By: Roanna Raider M.D.   On: 03/23/2015 03:01   Dg Chest Portable 1 View  03/22/2015  CLINICAL DATA:  Fevers EXAM: PORTABLE CHEST - 1 VIEW COMPARISON:  03/09/2015 FINDINGS: Cardiac shadow is stable. The lungs are were well aerated bilaterally. No focal infiltrate is seen. No bony abnormality is noted. IMPRESSION: No active disease. Electronically Signed   By: Alcide Clever M.D.   On: 03/22/2015 18:27   Dg Chest Port 1 View  03/09/2015  CLINICAL DATA:  69 year old female with dementia and confusion. Leukocytosis. EXAM: PORTABLE CHEST 1 VIEW COMPARISON:  01/11/2015 and prior chest radiographs FINDINGS: The cardiomediastinal silhouette is unremarkable. There is no evidence of focal airspace disease, pulmonary edema, suspicious pulmonary nodule/mass, pleural effusion, or pneumothorax. No acute bony abnormalities are identified. IMPRESSION: No active disease. Electronically Signed   By: Harmon Pier M.D.   On: 03/09/2015 19:17    Microbiology: Recent Results (from the past 240 hour(s))  Blood  Culture (routine x 2)     Status: None   Collection Time: 03/22/15  5:00 PM  Result Value Ref Range Status   Specimen Description BLOOD BLOOD RIGHT ARM  Final   Special Requests BOTTLES DRAWN AEROBIC AND ANAEROBIC 5CC  Final   Culture  Setup Time   Final    GRAM NEGATIVE RODS IN BOTH AEROBIC AND ANAEROBIC BOTTLES CRITICAL RESULT CALLED TO, READ BACK BY AND VERIFIED WITH: S. MAYES,RN AT 1036 ON [id number] BY Lucienne Capers    Culture   Final    PROTEUS MIRABILIS Performed at [facility]  Hospital    Report Status 03/25/2015 FINAL  Final   Organism ID, Bacteria PROTEUS MIRABILIS  Final      Susceptibility   Proteus mirabilis - MIC*    AMPICILLIN <=2 SENSITIVE Sensitive     CEFAZOLIN <=4 SENSITIVE Sensitive     CEFEPIME <=1 SENSITIVE Sensitive     CEFTAZIDIME <=1 SENSITIVE Sensitive     CEFTRIAXONE <=1 SENSITIVE Sensitive     CIPROFLOXACIN <=0.25 SENSITIVE Sensitive     GENTAMICIN <=1 SENSITIVE Sensitive     IMIPENEM 4 SENSITIVE Sensitive     TRIMETH/SULFA <=20 SENSITIVE Sensitive     AMPICILLIN/SULBACTAM <=2 SENSITIVE Sensitive     PIP/TAZO <=4 SENSITIVE Sensitive     * PROTEUS MIRABILIS  Blood Culture (routine x 2)     Status: None   Collection Time: 03/22/15  5:37 PM  Result Value Ref Range Status   Specimen Description BLOOD LEFT HAND  Final   Special Requests IN PEDIATRIC BOTTLE 2CC  Final   Culture  Setup Time   Final    GRAM NEGATIVE RODS AEROBIC BOTTLE ONLY CRITICAL RESULT CALLED TO, READ BACK BY AND VERIFIED WITH: S MAYES 03/23/15 @ 1428 M VESTAL    Culture   Final    PROTEUS MIRABILIS SUSCEPTIBILITIES PERFORMED ON PREVIOUS CULTURE WITHIN THE LAST 5 DAYS. Performed at [facility]    Report Status 03/25/2015 FINAL  Final  Urine culture     Status: None   Collection Time: 03/22/15  6:40 PM  Result Value Ref Range Status   Specimen Description URINE, CATHETERIZED  Final   Special Requests NONE  Final   Culture   Final    NO GROWTH 1 DAY Performed at [facility]    Report Status 03/24/2015 FINAL  Final  MRSA PCR Screening     Status: None   Collection Time: 03/23/15 12:16 AM  Result Value Ref Range Status   MRSA by PCR NEGATIVE NEGATIVE Final    Comment:        The GeneXpert MRSA Assay (FDA approved for NASAL specimens only), is one component of a comprehensive MRSA colonization surveillance program. It is not intended to diagnose MRSA infection nor to guide or monitor treatment for MRSA infections.      Labs: Basic Metabolic Panel:  Recent Labs Lab 03/22/15 1628 03/22/15 2038 03/23/15 0515  NA 146* 146* 141  K 6.0* 5.2* 4.4  CL 108 113* 114*  CO2 26 22 20*  GLUCOSE 228* 250* 275*  BUN 86* 80* 64*  CREATININE 3.29* 3.09* 2.33*  CALCIUM 8.9 8.2* 6.9*   Liver Function Tests:  Recent Labs Lab 03/22/15 1628  AST 127*  ALT 114*  ALKPHOS 182*  BILITOT 1.1  PROT 7.2  ALBUMIN 2.3*   No results for input(s): LIPASE, AMYLASE in the last 168 hours. No results for input(s): AMMONIA in the last 168 hours. CBC:  Recent Labs Lab 03/22/15 1628 03/23/15 0515 03/24/15 0410  WBC 13.6* 11.3* 10.1  NEUTROABS 12.9*  --   --   HGB 7.9* 6.1* 8.3*  HCT 24.8* 19.4* 25.0*  MCV 92.9 95.1 86.8  PLT 347 288 254   Cardiac Enzymes: No results for input(s): CKTOTAL, CKMB, CKMBINDEX, TROPONINI in the last 168 hours. BNP: BNP (last 3 results) No results for input(s): BNP in the last 8760 hours.  ProBNP (last 3 results) No results for input(s): PROBNP in the last 8760 hours.  CBG:  Recent Labs Lab 03/23/15 2359 03/24/15 0359 03/24/15  0751 03/24/15 1139 03/25/15 0802  GLUCAP 107* 100* 117* 119* 89    Signed:  Meleena Munroe MD PhD [facility] 03/25/2015, 11:13 AM

## 2015-03-24 NOTE — Progress Notes (Signed)
   03/24/15 1200 Hydrotherapy Evaluation-one time only  Subjective Assessment  Subjective patient yell out, crying at times, some words not sensible  Patient and Family Stated Goals spouse in room and agreed with wound care  Date of Onset (has had pressure sore for several weeks, wound larger than o)  Prior Treatments drssing changes in home  Evaluation and Treatment  Evaluation and Treatment Procedures Explained to Patient/Family Yes (spouse)  Evaluation and Treatment Procedures agreed to (spouse)  Wound / Incision (Open or Dehisced) 03/24/15 Other (Comment) Sacrum Medial large open wound to bone with undermining  Date First Assessed/Time First Assessed: 03/24/15 0930   Wound Type: Other (Comment)  Location: Sacrum  Location Orientation: Medial  Wound Description (Comments): large open wound to bone with undermining  Present on Admission: (c)   Dressing Type ABD;Gauze (Comment);Moist to dry  Dressing Changed New  Dressing Status Clean;Dry;Intact  Dressing Change Frequency Twice a day  Site / Wound Assessment Clean;Brown;Dusky;Granulation tissue;Painful;Pink;Yellow  % Wound base Red or Granulating 50%  % Wound base Yellow 25%  % Wound base Black 35% (connective tissue, bone)  Peri-wound Assessment Excoriated  Wound Length (cm) 15 cm  Wound Width (cm) 20 cm  Wound Depth (cm) 5 cm  Undermining (cm) 7 (from 5 oclock tothe left and up to 12oclock)  Margins Unattached edges (unapproximated)  Drainage Amount Moderate  Drainage Description Serosanguineous;Purulent;Odor  Non-staged Wound Description Full thickness  Treatment Hydrotherapy (Pulse lavage);Packing (Saline gauze)  Hydrotherapy  Pulsed lavage therapy - wound location scrum  Pulsed Lavage with Suction (psi) 4 psi  Pulsed Lavage with Suction - Normal Saline Used 1000 mL  Pulsed Lavage Tip Tip with splash shield  Wound Therapy - Assess/Plan/Recommendations  Wound Therapy - Clinical Statement patient has large open surgically  debided wound on sacrum. During treatment patient crying, yelling out profanities. Patient has Palliative care  following for goals of Care. This is a one time treatment as Palliative Care Physician has discontinued  Hydrotherpay after meeting with the family.  Wound Therapy Goals - Improve the function of patient's integumentary system by progressing the wound(s) through the phases of wound healing by:  Decrease Necrotic Tissue to (no goals set due to  discontinuation/Hospice level.)  03/24/2015 Blanchard KelchKaren Ruthie Berch PT [phone]

## 2015-03-24 NOTE — Consult Note (Signed)
[facility] Liaison: Received request from CSW Asher MuirJamie for [facility] Appreciate report from Dr. Phillips OdorGolding. [facility] room available for [name] [name] tomorrow, 03/25/15. Completed paper work with Mr. Thamas [name] earlier today. Dr. Kern Reaponald Hertweck to assume care per Mr. Rolly SalterLennon's request. CSW aware of plan to transfer tomorrow.  Please fax discharge summary to [phone].  RN Please call report to [phone].  Thank you. Forrestine Himva Samariyah Cowles, LCSW [phone]

## 2015-03-24 NOTE — Progress Notes (Signed)
Patient ID: [name] [name], female   DOB: April 04, 1946, 69 y.o.   MRN: [id number] 2 Days Post-Op  Subjective: Pt demented. Cussing at [place] while trying to do hydrotherapy.  Objective: Vital signs in last 24 hours: Temp:  [98.6 F (37 C)-100.6 F (38.1 C)] 99.5 F (37.5 C) (12/13 0630) Pulse Rate:  [51-87] 72 (12/13 0630) Resp:  [0-27] 25 (12/13 0630) BP: (137)/(37) 137/37 mmHg (12/12 1200) SpO2:  [40 %-100 %] 99 % (12/13 0630) Arterial Line BP: (102-158)/(35-71) 112/50 mmHg (12/13 0630) Last BM Date: 03/22/11  Intake/Output from previous day: 12/12 0701 - 12/13 0700 In: 3063.1 [I.V.:2243.1; Blood:670; IV Piggyback:150] Out: 1150 [Urine:1150] Intake/Output this shift: Total I/O In: -  Out: 350 [Urine:350]  PE: Skin: wound is actually quite clean.  There is a small amount of fibrin tight on the sacrum, and a small amount of necrotic tissue on the right lower portion of the wound, around 5 oclock.  Significant undermining of the entire left side of the wound, but overall clean.   Lab Results:   Recent Labs  03/23/15 0515 03/24/15 0410  WBC 11.3* 10.1  HGB 6.1* 8.3*  HCT 19.4* 25.0*  PLT 288 254   BMET  Recent Labs  03/22/15 2038 03/23/15 0515  NA 146* 141  K 5.2* 4.4  CL 113* 114*  CO2 22 20*  GLUCOSE 250* 275*  BUN 80* 64*  CREATININE 3.09* 2.33*  CALCIUM 8.2* 6.9*   PT/INR  Recent Labs  03/23/15 0026  LABPROT 17.0*  INR 1.38   CMP     Component Value Date/Time   NA 141 03/23/2015 0515   NA 142 05/01/2013 1053   K 4.4 03/23/2015 0515   CL 114* 03/23/2015 0515   CO2 20* 03/23/2015 0515   GLUCOSE 275* 03/23/2015 0515   GLUCOSE 86 05/01/2013 1053   BUN 64* 03/23/2015 0515   BUN 52* 05/01/2013 1053   CREATININE 2.33* 03/23/2015 0515   CALCIUM 6.9* 03/23/2015 0515   PROT 7.2 03/22/2015 1628   ALBUMIN 2.3* 03/22/2015 1628   AST 127* 03/22/2015 1628   ALT 114* 03/22/2015 1628   ALKPHOS 182* 03/22/2015 1628   BILITOT 1.1 03/22/2015 1628   GFRNONAA  20* 03/23/2015 0515   GFRAA 24* 03/23/2015 0515   Lipase     Component Value Date/Time   LIPASE 50 07/22/2007 1420       Studies/Results: Ct Pelvis Wo Contrast  03/22/2015  CLINICAL DATA:  Bed sore on the back to evaluate for sacral wound versus abscess. White cell count 13.6. Previous hysterectomy. EXAM: CT PELVIS WITHOUT CONTRAST TECHNIQUE: Multidetector CT imaging of the pelvis was performed following the standard protocol without intravenous contrast. COMPARISON:  CT pelvis 07/22/2007 FINDINGS: Skin defect over the sacral spine extending from about the level of L5-S1 along the midline down to the level of the coccyx. This skin defect extends from midline towards the right side of midline. There is a large amount of subcutaneous gas posterior to the sacrum and bilateral medial gluteal region. Gas collections infiltrate the subcutaneous fat with probable focal involvement of the gluteus muscles. This could represent abscess or skin infection due to gas-forming organism. There is no loculated fluid collection. Gas extends down to the level of the sacrum. There is no obvious bone erosion to suggest osteomyelitis. Small focal gas collection appears to be within the spinal canal at the level of S 3. This could indicate degenerative change, infection, instrumentation, or possibly fat. Pelvic organs demonstrate a Foley catheter  deflating the bladder. There is stool and residual contrast material in the rectum. No free or loculated pelvic fluid collections. No visualized pelvic mass or lymphadenopathy. Degenerative changes in the hips and lower lumbar spine. Degenerative disc disease at L5-S1. No displaced fractures identified. IMPRESSION: Localized but prominent soft tissue gas underlying a skin defect in the soft tissues over the sacral coccygeal spine. No obvious evidence of osteomyelitis. Possible gas within the spinal canal. Findings likely represent infection due to gas-forming organism. No loculated  fluid collection. These results were called by telephone at the time of interpretation on 03/22/2015 at 7:26 pm to Dr. Alvira MondayERIN SCHLOSSMAN , who verbally acknowledged these results. Electronically Signed   By: Burman NievesWilliam  Stevens M.D.   On: 03/22/2015 19:33   Dg Chest Port 1 View  03/23/2015  CLINICAL DATA:  Central line placement. Acute respiratory failure. Initial encounter. EXAM: PORTABLE CHEST 1 VIEW COMPARISON:  Chest radiograph performed 03/22/2015 FINDINGS: The patient's left IJ line is noted ending about the mid to distal SVC. The lungs are well-aerated. Minimal bilateral atelectasis is noted. There is no evidence of pleural effusion or pneumothorax. The cardiomediastinal silhouette is within normal limits. No acute osseous abnormalities are seen. IMPRESSION: 1. Left IJ line noted ending about the mid to distal SVC. 2. Minimal bilateral atelectasis noted. Electronically Signed   By: Roanna RaiderJeffery  Chang M.D.   On: 03/23/2015 03:01   Dg Chest Portable 1 View  03/22/2015  CLINICAL DATA:  Fevers EXAM: PORTABLE CHEST - 1 VIEW COMPARISON:  03/09/2015 FINDINGS: Cardiac shadow is stable. The lungs are were well aerated bilaterally. No focal infiltrate is seen. No bony abnormality is noted. IMPRESSION: No active disease. Electronically Signed   By: Alcide CleverMark  Lukens M.D.   On: 03/22/2015 18:27    Anti-infectives: Anti-infectives    Start     Dose/Rate Route Frequency Ordered Stop   03/24/15 1800  vancomycin (VANCOCIN) IVPB 1000 mg/200 mL premix     1,000 mg 200 mL/hr over 60 Minutes Intravenous Every 48 hours 03/22/15 1936     03/23/15 1800  piperacillin-tazobactam (ZOSYN) IVPB 3.375 g     3.375 g 12.5 mL/hr over 240 Minutes Intravenous Every 8 hours 03/23/15 1255     03/23/15 0000  piperacillin-tazobactam (ZOSYN) IVPB 2.25 g  Status:  Discontinued     2.25 g 100 mL/hr over 30 Minutes Intravenous 4 times per day 03/22/15 1936 03/22/15 2046   03/22/15 2115  piperacillin-tazobactam (ZOSYN) IVPB 2.25 g  Status:   Discontinued     2.25 g 100 mL/hr over 30 Minutes Intravenous 4 times per day 03/22/15 2046 03/23/15 1255   03/22/15 1630  vancomycin (VANCOCIN) 1,500 mg in sodium chloride 0.9 % 500 mL IVPB     1,500 mg 250 mL/hr over 120 Minutes Intravenous  Once 03/22/15 1558 03/22/15 2010   03/22/15 1600  piperacillin-tazobactam (ZOSYN) IVPB 3.375 g     3.375 g 100 mL/hr over 30 Minutes Intravenous  Once 03/22/15 1557 03/22/15 1812       Assessment/Plan  1. POD 1, s/p debridement of sacral decubitus ulcer -cont WD dressing changes -cont hydrotherapy and see how well she tolerates this.  If she does not tolerate this well, he wound is ultimately clean enough that I would be ok with DCing this given her overall mental state. -agree with palliative care meeting today. -no further infection noted.   LOS: 2 days    Ladine Kiper E 03/24/2015, 9:23 AM Pager: [phone]

## 2015-03-24 NOTE — Progress Notes (Signed)
NUTRITION NOTE  Consult received yesterday (12/12) for 48 hour calorie count. Pt remains NPO at this time. RD will continue to monitor for diet advancement and ability to initiate calorie count.    Trenton GammonJessica Marquie Aderhold, RD, LDN Inpatient Clinical Dietitian Pager # [phone] After hours/weekend pager # [phone]

## 2015-03-24 NOTE — Clinical Social Work Note (Signed)
Clinical Social Work Assessment  Patient Details  Name: [name] [name] MRN: [id number] Date of Birth: Jan 19, 1946  Date of referral:  03/24/15               Reason for consult:  Facility Placement, Discharge Planning, End of Life/Hospice                Permission sought to share information with:  Facility Art therapist granted to share information::  Yes, Verbal Permission Granted  Name::        Agency::     Relationship::     Contact Information:     Housing/Transportation Living arrangements for the past 2 months:  Single Family Home Source of Information:  Palliative Care Team, Other (Comment Required) (sister) Patient Interpreter Needed:  None Criminal Activity/Legal Involvement Pertinent to Current Situation/Hospitalization:  No - Comment as needed Significant Relationships:  Siblings, Spouse Lives with:  Spouse Do you feel safe going back to the place where you live?  No (Hospice home needed.) Need for family participation in patient care:  Yes (Comment)  Care giving concerns:  Pt's care cannot be managed at home following hospital d/c.   Social Worker assessment / plan:  Pt hospitalized on 03/22/15 with sepsis and gangrene with decubitus ulcer. CSW consulted to assist with d/c planning. Palliative Care Team has met with pt / family and has recommended Residential Hospice Home placement. Pt is mainly unresponsive. Family is in agreement with this plan and have requested [facility]. Liaison contacted and placement offered.  Family has accepted bed offer. CSW will continue to follow to assist with d/c planning to [facility] on 03/25/15.  Employment status:  Disabled (Comment on whether or not currently receiving Disability) Insurance information:  Managed Medicare PT Recommendations:  Not assessed at this time Information / Referral to community resources:  Other (Comment Required) (Residential Hospice Home)  Patient/Family's Response to care: Family  is in agreement with d/c plan.  Patient/Family's Understanding of and Emotional Response to Diagnosis, Current Treatment, and Prognosis:  Family is aware of pt's medical status and feel residential hospice home is best possible d/c plan.  Emotional Assessment Appearance:  Appears older than stated age Attitude/Demeanor/Rapport:  Unable to Assess Affect (typically observed):  Unable to Assess Orientation:  Oriented to Self Alcohol / Substance use:  Not Applicable Psych involvement (Current and /or in the community):  No (Comment)  Discharge Needs  Concerns to be addressed:  Discharge Planning Concerns Readmission within the last 30 days:  No Current discharge risk:  None Barriers to Discharge:  No Barriers Identified   Loraine Maple  [phone] 03/24/2015, 3:40 PM

## 2015-03-24 NOTE — Progress Notes (Addendum)
Name: [name] [name] MRN: [id number] DOB: 01-11-1946    ADMISSION DATE:  03/22/2015 CONSULTATION DATE:  03/23/15  REFERRING MD :  Dr Cena Benton  REASON FOR CONSULT: hypotension s/p surgery  HISTORY OF PRESENT ILLNESS:  69yo AAF w/dementia from alzheimers presented to ED yesterday by caregiver for a wound infection. She was noted by the surgery team to have wet gangrene on a stage 4 decubitus ulcer. She underwent an emergent operative debridement and presented back to the stepdown unit hypotensive.   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  has past surgical history that includes Abdominal hysterectomy; Stomach surgery; Thyroid surgery; Dental surgery; and Incision and drainage perirectal abscess (N/A, 03/22/2015). Prior to Admission medications   Medication Sig Start Date End Date Taking? Authorizing Provider  aspirin EC 325 MG tablet Take 325 mg by mouth daily.   Yes Historical Provider, MD  atorvastatin (LIPITOR) 20 MG tablet Take 20 mg by mouth daily.     Yes Historical Provider, MD  clonazePAM (KLONOPIN) 0.5 MG tablet Take 0.5 mg by mouth at bedtime as needed for anxiety.    Yes Historical Provider, MD  collagenase (SANTYL) ointment Apply topically daily. 03/11/15  Yes Dorothea Ogle, MD  donepezil (ARICEPT) 10 MG tablet Take 10 mg by mouth at bedtime.   Yes Historical Provider, MD  furosemide (LASIX) 20 MG tablet Take 20 mg by mouth daily as needed (leg swelling).   Yes Historical Provider, MD  omega-3 acid ethyl esters (LOVAZA)  1 G capsule Take 1 g by mouth daily.   Yes Historical Provider, MD  sennosides-docusate sodium (SENOKOT-S) 8.6-50 MG tablet Take 1 tablet by mouth as needed for constipation.   Yes Historical Provider, MD  venlafaxine (EFFEXOR) 75 MG tablet Take 75 mg by mouth daily.    Yes Historical Provider, MD   Allergies  Allergen Reactions  . Benazepril-Hydrochlorothiazide Other (See Comments)    unknown    FAMILY HISTORY:  family history includes Diabetes in her brother, brother, mother, sister, sister, sister, sister, sister, and sister. SOCIAL HISTORY:  reports that she has never smoked. She has never used smokeless tobacco. She reports that she does not drink alcohol or use illicit drugs.  REVIEW OF SYSTEMS:   Awake, demented  SUBJECTIVE: Off all pressors. She continues to be stable but confused and demented.  VITAL SIGNS: Temp:  [98.6 F (37 C)-100.6 F (38.1 C)] 99.5 F (37.5 C) (12/13 0630) Pulse Rate:  [51-87] 72 (12/13 0630) Resp:  [0-27] 25 (12/13 0630) BP: (137)/(37) 137/37 mmHg (12/12 1200) SpO2:  [40 %-100 %] 99 % (12/13 0630) Arterial Line BP: (102-158)/(35-71) 112/50 mmHg (12/13 0630)  PHYSICAL EXAMINATION: General: Somnolent, no distress.  Neuro:  No gross focal deficits HEENT: PERRLA, moist mucous membranes. Cardiovascular: S1-S2, regular rate and rhythm, systolic murmur.  Lungs:  Clear to auscultation, no wheeze, crackles, non-labored breathing. Abdomen:  Soft, nontender, nondistended, positive bowel sounds Skin:  Intact, Stage 4 sacral decubitus ulcer currently bandaged.   Recent Labs Lab 03/22/15 1628 03/22/15 2038 03/23/15 0515  NA 146* 146* 141  K 6.0* 5.2* 4.4  CL 108 113* 114*  CO2 26 22  20*  BUN 86* 80* 64*  CREATININE 3.29* 3.09* 2.33*  GLUCOSE 228* 250* 275*    Recent Labs Lab 03/22/15 1628 03/23/15 0515 03/24/15 0410  HGB 7.9* 6.1* 8.3*  HCT 24.8* 19.4* 25.0*  WBC 13.6* 11.3* 10.1  PLT 347 288 254   Ct Pelvis Wo Contrast  03/22/2015   CLINICAL DATA:  Bed sore on the back to evaluate for sacral wound versus abscess. White cell count 13.6. Previous hysterectomy. EXAM: CT PELVIS WITHOUT CONTRAST TECHNIQUE: Multidetector CT imaging of the pelvis was performed following the standard protocol without intravenous contrast. COMPARISON:  CT pelvis 07/22/2007 FINDINGS: Skin defect over the sacral spine extending from about the level of L5-S1 along the midline down to the level of the coccyx. This skin defect extends from midline towards the right side of midline. There is a large amount of subcutaneous gas posterior to the sacrum and bilateral medial gluteal region. Gas collections infiltrate the subcutaneous fat with probable focal involvement of the gluteus muscles. This could represent abscess or skin infection due to gas-forming organism. There is no loculated fluid collection. Gas extends down to the level of the sacrum. There is no obvious bone erosion to suggest osteomyelitis. Small focal gas collection appears to be within the spinal canal at the level of S 3. This could indicate degenerative change, infection, instrumentation, or possibly fat. Pelvic organs demonstrate a Foley catheter deflating the bladder. There is stool and residual contrast material in the rectum. No free or loculated pelvic fluid collections. No visualized pelvic mass or lymphadenopathy. Degenerative changes in the hips and lower lumbar spine. Degenerative disc disease at L5-S1. No displaced fractures identified. IMPRESSION: Localized but prominent soft tissue gas underlying a skin defect in the soft tissues over the sacral coccygeal spine. No obvious evidence of osteomyelitis. Possible gas within the spinal canal. Findings likely represent infection due to gas-forming organism. No loculated fluid collection. These results were called by telephone at the time of interpretation on 03/22/2015 at 7:26 pm to Dr. Alvira Monday , who verbally acknowledged these results.  Electronically Signed   By: Burman Nieves M.D.   On: 03/22/2015 19:33   Dg Chest Port 1 View  03/23/2015  CLINICAL DATA:  Central line placement. Acute respiratory failure. Initial encounter. EXAM: PORTABLE CHEST 1 VIEW COMPARISON:  Chest radiograph performed 03/22/2015 FINDINGS: The patient's left IJ line is noted ending about the mid to distal SVC. The lungs are well-aerated. Minimal bilateral atelectasis is noted. There is no evidence of pleural effusion or pneumothorax. The cardiomediastinal silhouette is within normal limits. No acute osseous abnormalities are seen. IMPRESSION: 1. Left IJ line noted ending about the mid to distal SVC. 2. Minimal bilateral atelectasis noted. Electronically Signed   By: Roanna Raider M.D.   On: 03/23/2015 03:01   Dg Chest Portable 1 View  03/22/2015  CLINICAL DATA:  Fevers EXAM: PORTABLE CHEST - 1 VIEW COMPARISON:  03/09/2015 FINDINGS: Cardiac shadow is stable. The lungs are were well aerated bilaterally. No focal infiltrate is seen. No bony abnormality is noted. IMPRESSION: No active disease. Electronically Signed   By: Alcide Clever M.D.   On: 03/22/2015 18:27    DISCUSSION: 69 year old with Alzheimer's status POD #1 sacral wound debridement. Now in septic shock, hypotensive post op  ASSESSMENT / PLAN:  PULMONARY A: Stable, no evidence of pneumonia. P:   Wean down O2 as tolerated.  CARDIOVASCULAR A:  Shock, septic Lactic acidosis P:  Of all pressors. BP stable.Marland Kitchen  RENAL A:  AKI CKD at baseline P:   Urine output is picking up and creatinine improving.  GASTROINTESTINAL A:   No issues P:   Keep nothing by mouth. By mouth feeds when approved by surgery.  HEMATOLOGIC A:   Hb 6.1. 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  FAMILY  - Updates:  Ongoing discussions with palliative care. Patient is moving towards comfort care. - Inter-disciplinary family meet or Palliative Care meeting due by:  12/19  Chilton GreathousePraveen Jermany Rimel MD [facility] Pager [phone] If no answer or after 3pm call: [phone] 03/24/2015, 9:28 AM

## 2015-03-25 LAB — GLUCOSE, CAPILLARY: GLUCOSE-CAPILLARY: 89 mg/dL (ref 65–99)

## 2015-03-25 LAB — CULTURE, BLOOD (ROUTINE X 2)

## 2015-03-25 MED ORDER — MORPHINE SULFATE 10 MG/5ML PO SOLN: 2.5000 mg | ORAL | Status: AC | PRN

## 2015-03-25 MED ORDER — LORAZEPAM 2 MG/ML PO CONC: 0.5000 mg | ORAL | Status: AC | PRN

## 2015-03-25 NOTE — Progress Notes (Signed)
Nutrition Brief Note  Pt identified as at nutrition risk on the Malnutrition Screen Tool. Calorie Count order was discontinued.  Chart reviewed. Pt now transitioning to comfort care.  No further nutrition interventions warranted at this time.  Please re-consult as needed.   Tilda FrancoLindsey Jamea Robicheaux, MS, RD, LDN Pager: [phone] After Hours Pager: [phone]

## 2015-03-25 NOTE — Care Management Important Message (Signed)
Important Message  Patient Details  Name: [name] [name] MRN: [id number] Date of Birth: 1946/03/07   Medicare Important Message Given:  Yes    Haskell FlirtJamison, Lurena Naeve 03/25/2015, 12:15 PMImportant Message  Patient Details  Name: [name] [name] MRN: [id number] Date of Birth: 1946/03/07   Medicare Important Message Given:  Yes    Haskell FlirtJamison, Dulcemaria Bula 03/25/2015, 12:15 PM

## 2015-03-25 NOTE — Progress Notes (Signed)
[facility] Family and patient agreeable to this plan- plan transfer via EMS. [name] LevyJanet Jhaniya Briski, MSW, Theresia MajorsLCSWA [phone]

## 2015-03-26 LAB — TYPE AND SCREEN
ABO/RH(D): O POS
ANTIBODY SCREEN: NEGATIVE
UNIT DIVISION: 0
UNIT DIVISION: 0
UNIT DIVISION: 0
Unit division: 0

## 2015-03-27 DIAGNOSIS — I96 Gangrene, not elsewhere classified: Secondary | ICD-10-CM | POA: Insufficient documentation

## 2015-10-15 DIAGNOSIS — Z515 Encounter for palliative care: Secondary | ICD-10-CM | POA: Insufficient documentation

## 2017-01-23 NOTE — Telephone Encounter (Signed)
Close Encounter 

## 2017-04-30 IMAGING — CT CT HEAD W/O CM
1 of 2 series · 16 of 30 positions shown, 20 images · non-contrast
Comparison: January 09, 2014

CLINICAL DATA: Altered mental status with increased confusion.
History of underlying dementia.

EXAM:
CT HEAD WITHOUT CONTRAST
TECHNIQUE: Contiguous axial images were obtained from the base of the skull
through the vertex without intravenous contrast.

[Series 3: head 2.0 h70h · axial · 0.42mm/px · z∈[-90,+48]mm · 16 of 77 slices shown, 20 images]
[im 4/77  brain]
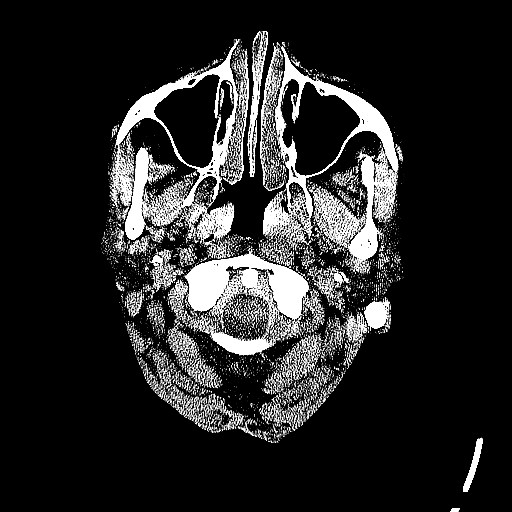
[im 4/77  bone]
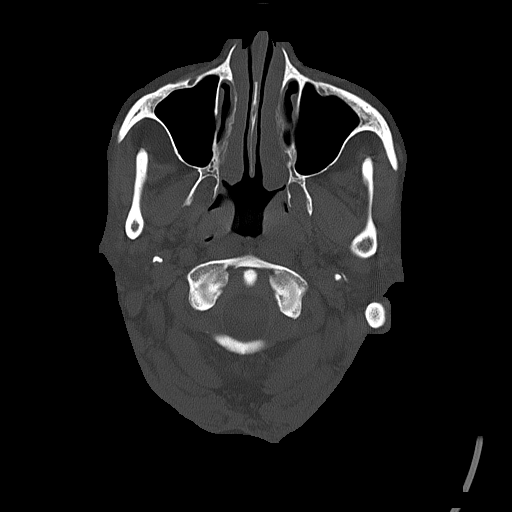
[im 8/77  brain]
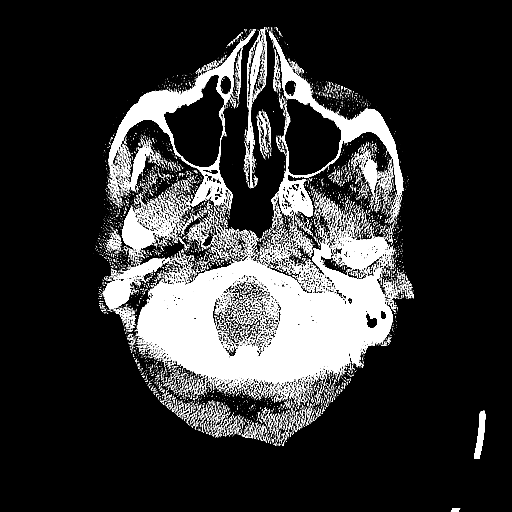
[im 12/77  brain]
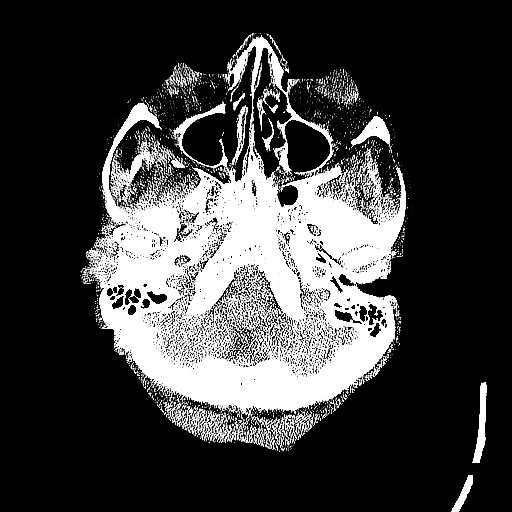
[im 20/77  brain]
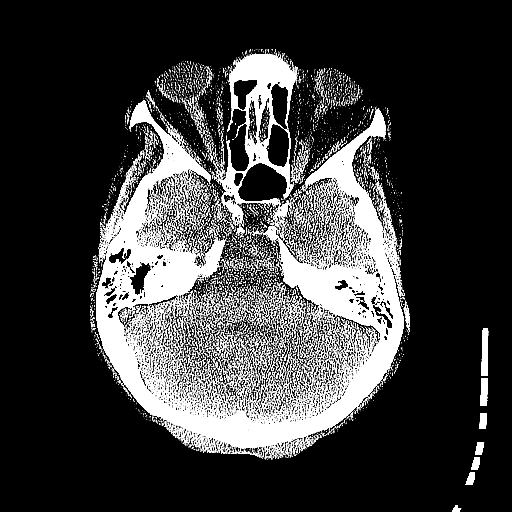
[im 23/77  brain]
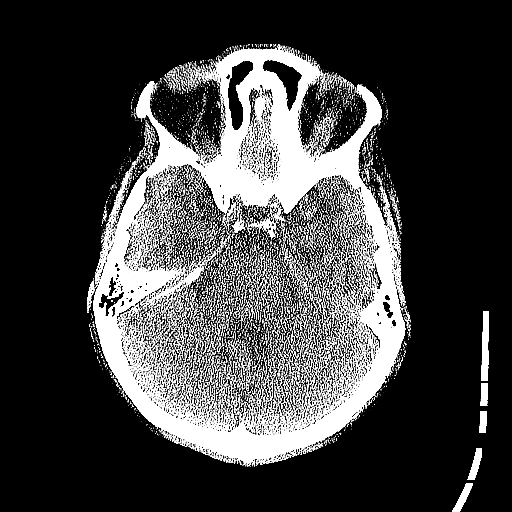
[im 23/77  bone]
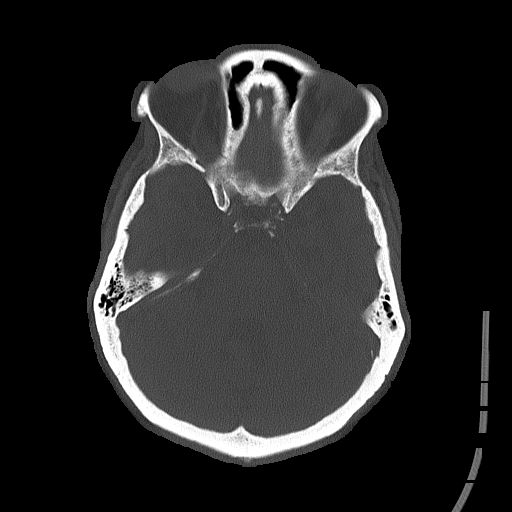
[im 27/77  brain]
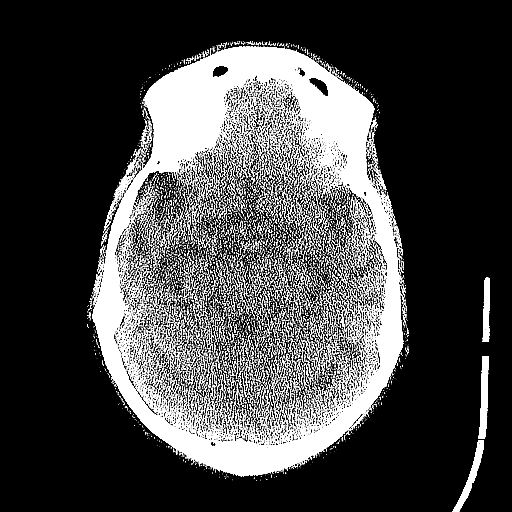
[im 31/77  brain]
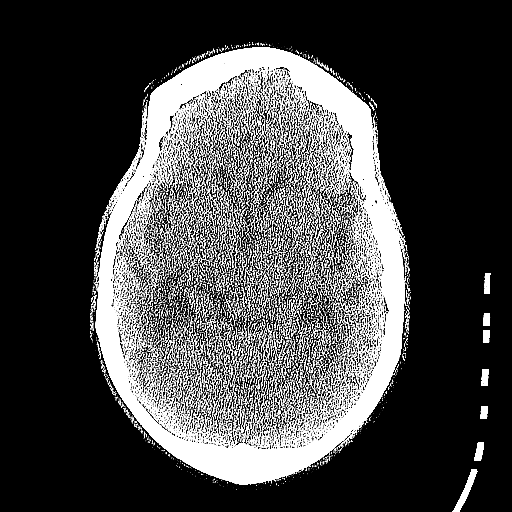
[im 35/77  brain]
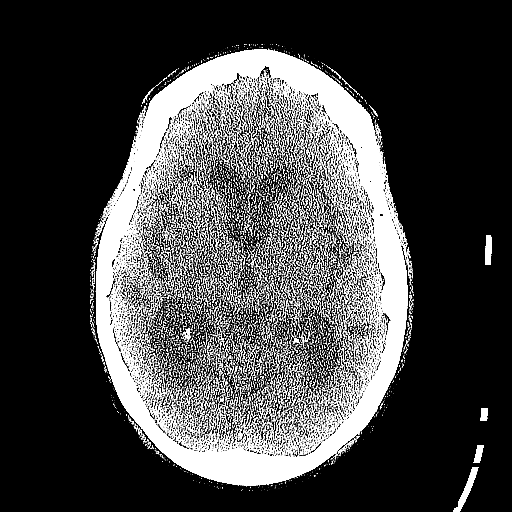
[im 42/77  brain]
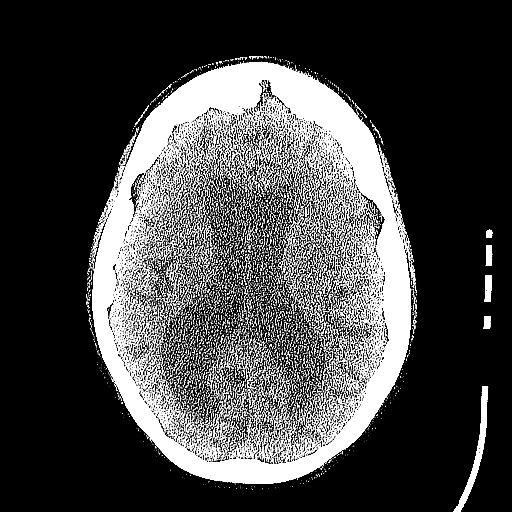
[im 42/77  bone]
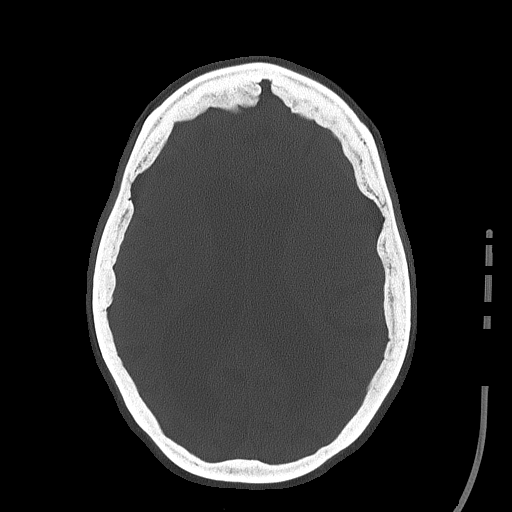
[im 46/77  brain]
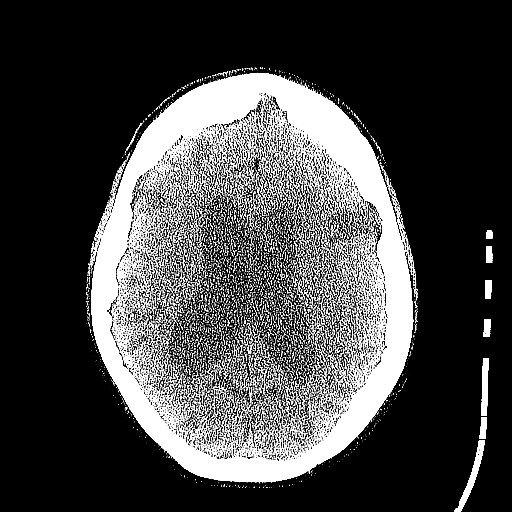
[im 50/77  brain]
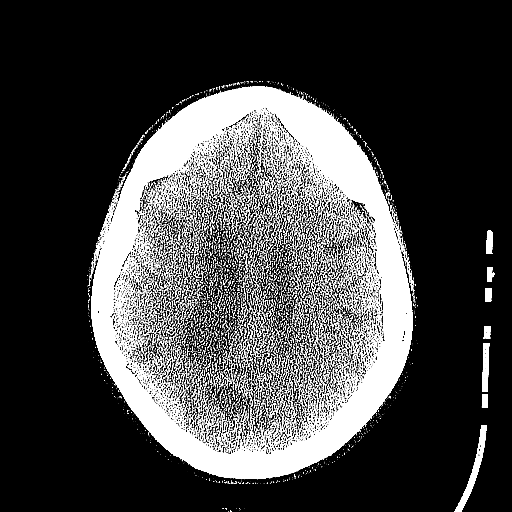
[im 54/77  brain]
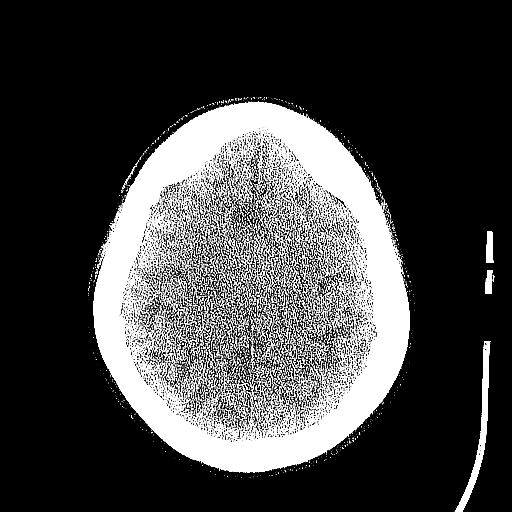
[im 58/77  brain]
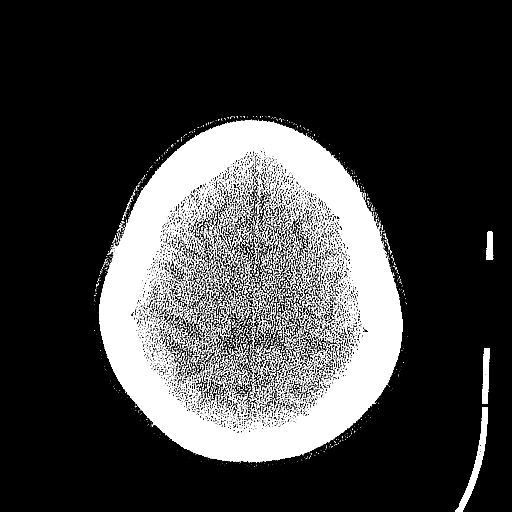
[im 58/77  bone]
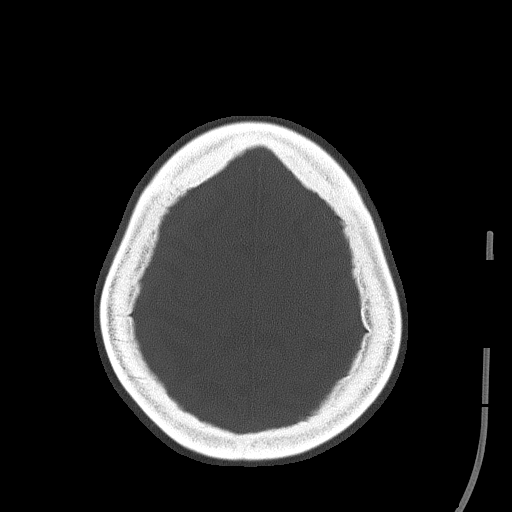
[im 65/77  brain]
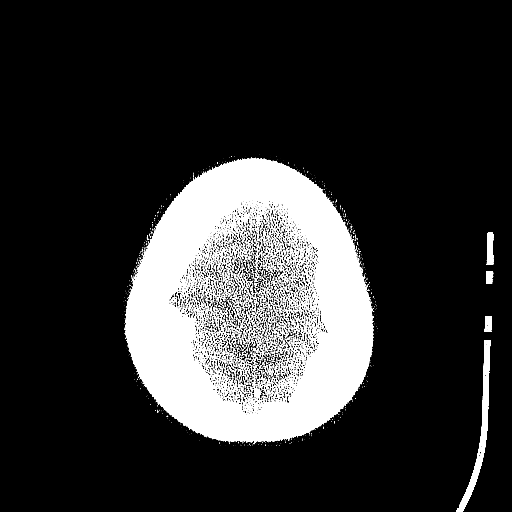
[im 69/77  brain]
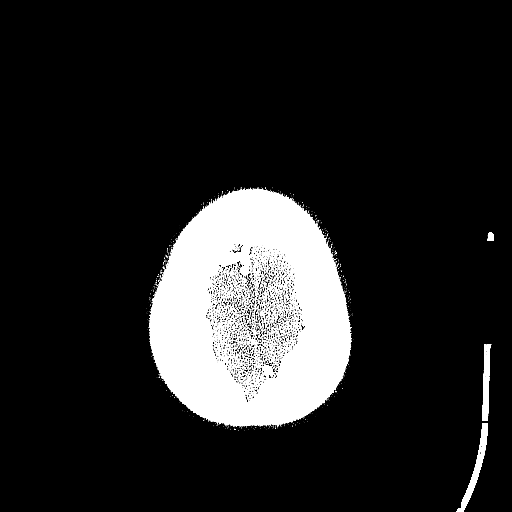
[im 73/77  brain]
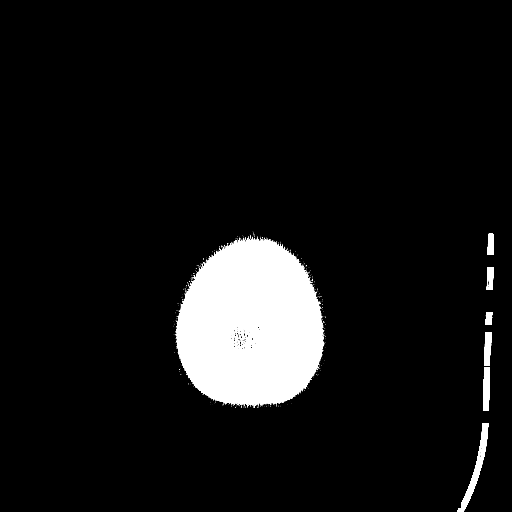

[16 of 30 positions shown; findings below may reference images not displayed]

FINDINGS: Moderate diffuse atrophy is stable. There is no intracranial mass,
hemorrhage, extra-axial fluid collection, or midline shift. There is
mild patchy small vessel disease in the centra semiovale bilaterally
as well as in the mid pons in the basilar perforator distribution.
No acute infarct evident. No new gray-white compartment lesion. The
bony calvarium appears intact. The mastoid air cells are clear.
IMPRESSION: Stable generalized atrophy. Stable supratentorial and infratentorial
small vessel disease. No acute infarct evident. No hemorrhage or
mass effect.

## 2017-04-30 IMAGING — CR DG CHEST 1V
1 series · 1 of 1 positions shown · non-contrast
Comparison: January 09, 2014

CLINICAL DATA: Confusion with loss of appetite.  Hypertension.

EXAM:
CHEST 1 VIEW

[chest pa]
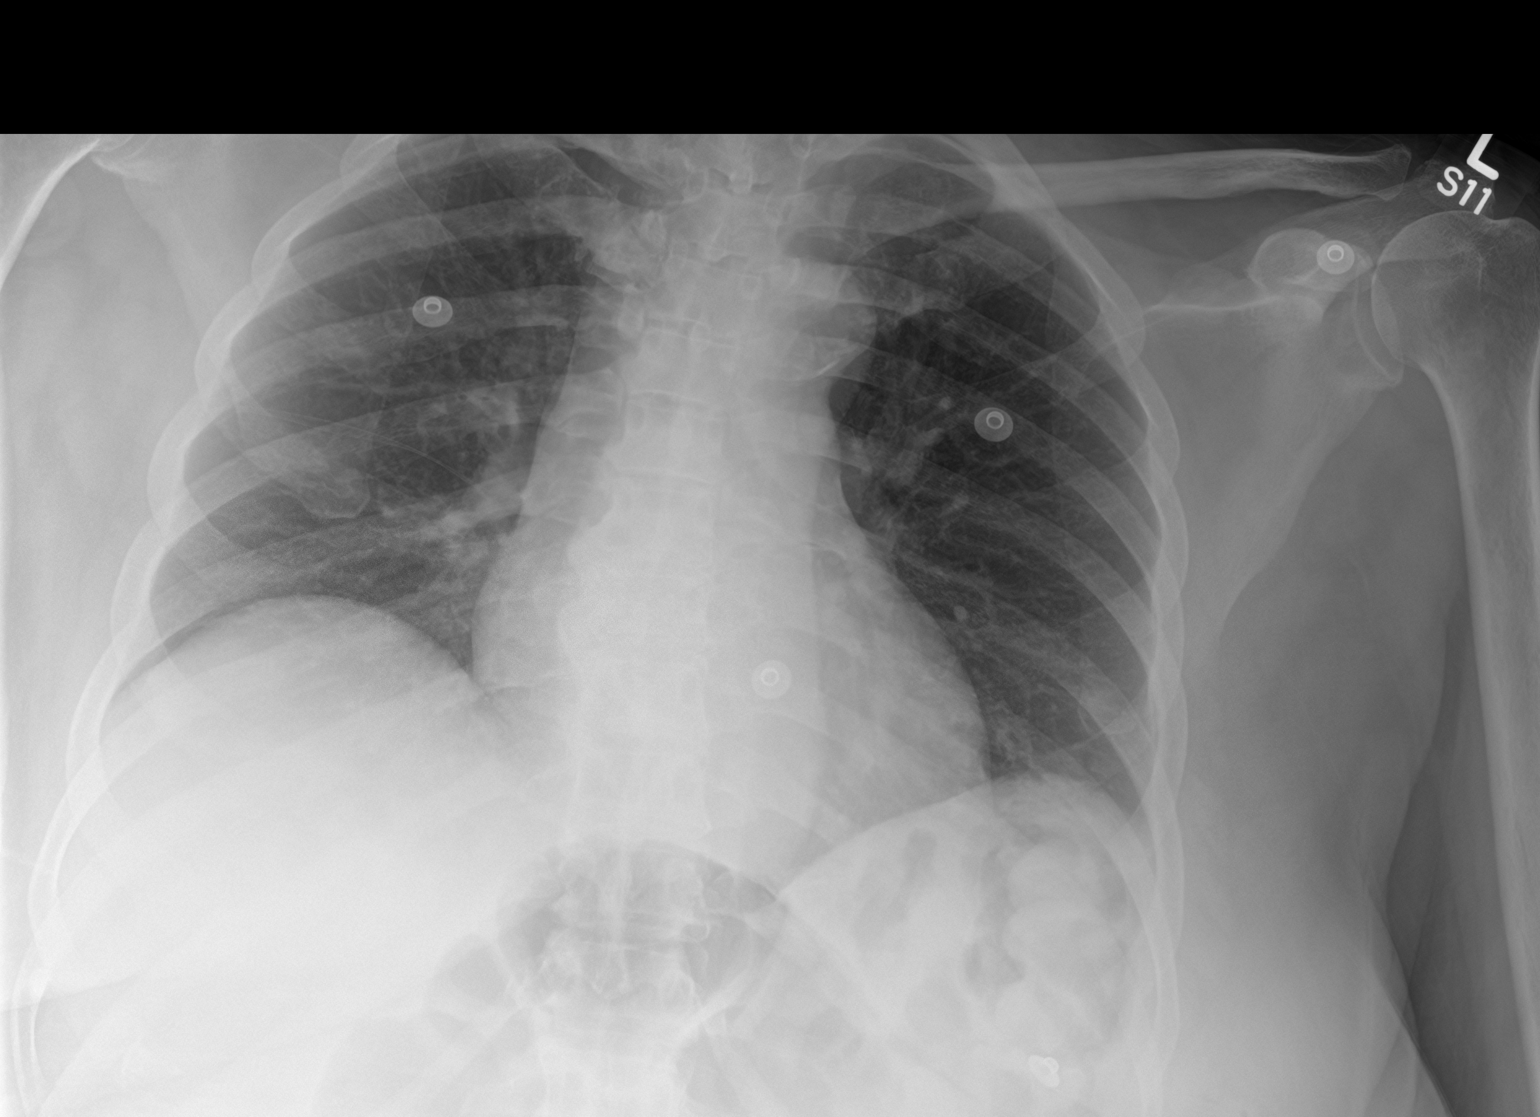

[1 of 1 positions shown; findings below may reference images not displayed]

FINDINGS: Lungs are clear. Heart size and pulmonary vascularity are normal. No
adenopathy. No bone lesions. There is degenerative change in the
thoracic spine.
IMPRESSION: No edema or consolidation.
